# Patient Record
Sex: Male | Born: 1960
Health system: Southern US, Community
[De-identification: ages and names within clinical notes are randomized; demographics above are authoritative.]

## PROBLEM LIST (undated history)

## (undated) DIAGNOSIS — E785 Hyperlipidemia, unspecified: Secondary | ICD-10-CM

## (undated) DIAGNOSIS — T7840XA Allergy, unspecified, initial encounter: Secondary | ICD-10-CM

## (undated) DIAGNOSIS — J449 Chronic obstructive pulmonary disease, unspecified: Secondary | ICD-10-CM

## (undated) DIAGNOSIS — J45909 Unspecified asthma, uncomplicated: Secondary | ICD-10-CM

## (undated) HISTORY — DX: Unspecified asthma, uncomplicated: J45.909

## (undated) HISTORY — DX: Hyperlipidemia, unspecified: E78.5

## (undated) HISTORY — PX: COLONOSCOPY: SHX174

## (undated) HISTORY — DX: Allergy, unspecified, initial encounter: T78.40XA

## (undated) HISTORY — DX: Chronic obstructive pulmonary disease, unspecified: J44.9

---

## 2017-07-05 ENCOUNTER — Ambulatory Visit (INDEPENDENT_AMBULATORY_CARE_PROVIDER_SITE_OTHER): Payer: BLUE CROSS/BLUE SHIELD | Admitting: Family Medicine

## 2017-07-05 ENCOUNTER — Ambulatory Visit (INDEPENDENT_AMBULATORY_CARE_PROVIDER_SITE_OTHER): Payer: BLUE CROSS/BLUE SHIELD

## 2017-07-05 ENCOUNTER — Encounter: Payer: Self-pay | Admitting: Family Medicine

## 2017-07-05 VITALS — BP 114/82 | HR 64 | Temp 97.9°F | Resp 16 | Ht 62.0 in | Wt 146.2 lb

## 2017-07-05 DIAGNOSIS — Z1159 Encounter for screening for other viral diseases: Secondary | ICD-10-CM

## 2017-07-05 DIAGNOSIS — Z125 Encounter for screening for malignant neoplasm of prostate: Secondary | ICD-10-CM

## 2017-07-05 DIAGNOSIS — R05 Cough: Secondary | ICD-10-CM

## 2017-07-05 DIAGNOSIS — Z0000 Encounter for general adult medical examination without abnormal findings: Secondary | ICD-10-CM

## 2017-07-05 DIAGNOSIS — Z1211 Encounter for screening for malignant neoplasm of colon: Secondary | ICD-10-CM

## 2017-07-05 DIAGNOSIS — R059 Cough, unspecified: Secondary | ICD-10-CM

## 2017-07-05 DIAGNOSIS — Z23 Encounter for immunization: Secondary | ICD-10-CM | POA: Diagnosis not present

## 2017-07-05 DIAGNOSIS — Z114 Encounter for screening for human immunodeficiency virus [HIV]: Secondary | ICD-10-CM

## 2017-07-05 DIAGNOSIS — Z131 Encounter for screening for diabetes mellitus: Secondary | ICD-10-CM

## 2017-07-05 DIAGNOSIS — J4520 Mild intermittent asthma, uncomplicated: Secondary | ICD-10-CM

## 2017-07-05 DIAGNOSIS — Z13220 Encounter for screening for lipoid disorders: Secondary | ICD-10-CM

## 2017-07-05 MED ORDER — ALBUTEROL SULFATE HFA 108 (90 BASE) MCG/ACT IN AERS: 1.0000 | INHALATION_SPRAY | RESPIRATORY_TRACT | 0 refills | Status: DC | PRN

## 2017-07-05 MED ORDER — FLUTICASONE PROPIONATE HFA 44 MCG/ACT IN AERO: 2.0000 | INHALATION_SPRAY | Freq: Two times a day (BID) | RESPIRATORY_TRACT | 3 refills | Status: DC

## 2017-07-05 NOTE — Patient Instructions (Addendum)
Cough may be due to multiple causes: Try zyrtec once per day for allergies, zantac once or  twice per day for heartburn, and start new inhaler fluticasone 2 times per day (every day).  Can use albuterol inhaler IF NEEDED for wheezing or worse cough. Recheck cough in next 3-4 weeks.  Return to the clinic or go to the nearest emergency room if any of your symptoms worsen or new symptoms occur.  I will refer you for colonoscopy.   Thanks you for coming in today!  Keeping you healthy  Get these tests  Blood pressure- Have your blood pressure checked once a year by your healthcare provider.  Normal blood pressure is 120/80  Weight- Have your body mass index (BMI) calculated to screen for obesity.  BMI is a measure of body fat based on height and weight. You can also calculate your own BMI at [email or web address].  Cholesterol- Have your cholesterol checked every year.  Diabetes- Have your blood sugar checked regularly if you have high blood pressure, high cholesterol, have a family history of diabetes or if you are overweight.  Screening for Colon Cancer- Colonoscopy starting at age 69.  Screening may begin sooner depending on your family history and other health conditions. Follow up colonoscopy as directed by your Gastroenterologist.  Screening for Prostate Cancer- Both blood work (PSA) and a rectal exam help screen for Prostate Cancer.  Screening begins at age 37 with African-American men and at age 30 with Caucasian men.  Screening may begin sooner depending on your family history.  Take these medicines  Aspirin- One aspirin daily can help prevent Heart disease and Stroke.  Flu shot- Every fall.  Tetanus- Every 10 years.  Zostavax- Once after the age of 15 to prevent Shingles.  Pneumonia shot- Once after the age of 60; if you are younger than 65, ask your healthcare provider if you need a Pneumonia shot.  Take these steps  Don't smoke- If you do smoke, talk to your doctor  about quitting.  For tips on how to quit, go to www.smokefree.gov or call 1-800-QUIT-NOW.  Be physically active- Exercise 5 days a week for at least 30 minutes.  If you are not already physically active start slow and gradually work up to 30 minutes of moderate physical activity.  Examples of moderate activity include walking briskly, mowing the yard, dancing, swimming, bicycling, etc.  Eat a healthy diet- Eat a variety of healthy food such as fruits, vegetables, low fat milk, low fat cheese, yogurt, lean meant, poultry, fish, beans, tofu, etc. For more information go to www.thenutritionsource.org  Drink alcohol in moderation- Limit alcohol intake to less than two drinks a day. Never drink and drive.  Dentist- Brush and floss twice daily; visit your dentist twice a year.  Depression- Your emotional health is as important as your physical health. If you're feeling down, or losing interest in things you would normally enjoy please talk to your healthcare provider.  Eye exam- Visit your eye doctor every year.  Safe sex- If you may be exposed to a sexually transmitted infection, use a condom.  Seat belts- Seat belts can save your life; always wear one.  Smoke/Carbon Monoxide detectors- These detectors need to be installed on the appropriate level of your home.  Replace batteries at least once a year.  Skin cancer- When out in the sun, cover up and use sunscreen 15 SPF or higher.  Violence- If anyone is threatening you, please tell your healthcare provider.  Living Will/ Health  care power of attorney- Speak with your healthcare provider and family.  Ho, Ng???i l??n (Cough, Adult) Ho l ph?n x? la?m sa?ch c? h?ng va? khi? qua?n cu?a quy? vi?. Ho gip ch?a lnh v b?o v? ph?i c?a quy? vi?. Thi?nh thoa?ng ho l bnh th??ng, nh?ng ho ke?m theo cc tri?u ch?ng khc ho?c ko di c th? l m?t d?u hi?u c?a m?t b?nh ly? c?n ?i?u tr?Marland Kitchen Ho c th? ko da?i ch? 2-3 tu?n (c?p tnh), ho?c n c th? ko di  h?n 8 tu?n (m?n tnh). [place] th??ng la? do:  Hi?t vo cc ch?t gy kch ?ng ph?i c?a quy? vi?.  Nhi?m vi rt ho?c vi khu?n ???ng h h?p.  D? ?ng.  Hen suy?n.  Cha?y di?ch mu?i sau.  Ht thu?c l.  Axi?t tra?o ng???c t?? d? dy ln th?c qu?n (tro ng??c d? dy th??c qua?n).  M?t s? lo?i thu?c nh?t ??nh.  Nh??ng v?n ?? ph?i ma?n tnh, k? c? COPD (ho?c hi?m khi, ung th? ph?i).  Cc ti?nh tra?ng b?nh ly? kha?c ch?ng h?n nh? suy tim. H??NG D?N CH?M [place] T?I NH Ch  ??n nh?ng thay ??i v? tri?u ch?ng c?a qu v?. Ti?n hnh nh?ng hnh ??ng sau ?? gip gi?m kho? chi?u:  Ch? s? d?ng thu?c theo ch? d?n c?a chuyn gia ch?m [place] s?c kh?e cu?a quy? vi?. ? N?u qu v? ???c k thu?c khng sinh, hy dng thu?c theo ch? d?n c?a chuyn gia ch?m [place] s?c kh?e. Khng d?ng u?ng thu?c khng sinh ngay c? khi qu v? b?t ??u c?m th?y ?? h?n. ? Ni chuy?n v?i chuyn gia ch?m [place] s?c kh?e tr??c khi quy? vi? du?ng m?t lo?i thu?c gia?m ho.  U?ng ?? n??c ?? gi? cho n??c ti?u trong ho?c c mu vng nh?t.  N?u khng kh kh, ha?y s? d?ng m?t ma?y h?i n???c ho??c ma?y t?o ?m trong phng ng? ho?c nha? quy? vi? ?? gip la?m l?ng di?ch ti?t ra.  Trnh b?t c? ?i?u g la?m quy? vi? ho t?i n?i lm vi?c hay ? nh.  N?u quy? vi? ho n??ng h?n vo ban ?m, hy th? ng? ?? v? tr n??a ng?i n??a n??m.  Hessie Diener khi thu?c l. N?u qu v? ht thu?c, ha?y cai thu?c. N?u qu v? c?n gip ?? ?? cai thu?c, hy h?i chuyn gia ch?m [place] s?c kh?e.  Trnh caffeine.  Trnh u?ng r??u.  Ngh? ng?i khi c?n. ?I KHM N?U:  Qu v? c cc tri?u ch?ng m?i.  Quy? vi? ho ra m?Floyde Parkins? vi? khng ??? ho sau 2-3 tu?n, ho?c quy? vi? ho n?ng h?n.  Qu v? khng th? ki?m sot c?n ho c?a mnh b?ng thu?c ho v quy? vi? b? m?t ng?.  Quy? vi? bi? ?au ma? tr?? nn n??ng h?n ho?c ?au m khng ki?m sot ????c b??ng thu?c gia?m ?au.  Qu v? b? s?t.  Qu v? b? s?t cn khng r nguyn nhn.  Qu v? ?? m? hi ban ?m.  NGAY L?P T?C  ?I KHM N?U:  Qu v? ho ra mu.  Qu v? b? kh th?.  Nh?p tim c?a quy? vi? r?t nhanh.  Thng tin ny khng nh?m m?c ?ch thay th? cho l?i khuyn m chuyn gia ch?m [place] s?c kh?e ni v?i qu v?. Hy b?o ??m qu v? ph?i th?o lu?n b?t k? v?n ?? g m qu v? c v?i chuyn gia ch?m  s?c kh?e c?a qu v?. Document Released: 12/29/2015 Document Revised: 12/29/2015 Document  Reviewed: 11/13/2014 Elsevier Interactive Patient Education  2017 [employer].   IF you received an x-ray today, you will receive an invoice from [facility] Radiology. Please contact [facility] Radiology at [phone] with questions or concerns regarding your invoice.   IF you received labwork today, you will receive an invoice from [place]. Please contact [facility] at [phone] with questions or concerns regarding your invoice.   Our billing staff will not be able to assist you with questions regarding bills from these companies.  You will be contacted with the lab results as soon as they are available. The fastest way to get your results is to activate your My Chart account. Instructions are located on the last page of this paperwork. If you have not heard from [place] regarding the results in 2 weeks, please contact this office.

## 2017-07-05 NOTE — Progress Notes (Signed)
Subjective:  By signing my name below, I, Timothy Briggs, attest that this documentation has been prepared under the direction and in the presence of Timothy Agreste, MD Electronically Signed: Ladene Artist, ED Scribe 07/05/2017 at 1:59 PM.   Patient ID: Timothy Briggs, male    DOB: [birth year], 56 y.o.   MRN: [id number]  Chief Complaint  Patient presents with  . Annual Exam  . Cough    stated on history form he has had a cough for awhile   HPI Timothy Briggs is a 56 y.o. male who presents to Primary Care at [facility] for a physical. New pt to our office. He is also presenting with cough. Pt's girlfriend is present to translate.   Cough Pt reports he has had a productive cough with mucous for several years. Reports some congestion, itchy and watery eyes. Previously prescribed Zyrtec by his former PCP which he is no longer taking. Pt has been seen at [facility] twice last year for asthma exacerbation, given albuterol inhaler which he takes once/week. Pt states albuterol improves cough temporarily but cough returns daily. Pt had a negative TB screening since [place], reports cough started here. Denies exposure to TB, waking in the middle of the night sob, hemoptysis, night sweats, fever, unexplained weight loss, heartburn, belching. Pt is a former smoker; started in 1980, quit for 10 years and quit again in 01/2016.   CA Screening Colonoscopy: ~10 years ago for hemorrhoids  Prostate CA Screening:  No results found for: PSA  Immunizations  There is no immunization history on file for this patient. Flu vaccine: today Tetanus: today Hep C Screening: agrees to screening at this visit HIV Screening: agrees to screening at this visit  Depression Screening Depression screen [facility] 2/9 07/05/2017  Decreased Interest 0  Down, Depressed, Hopeless 0  PHQ - 2 Score 0     Visual Acuity Screening   Right eye Left eye Both eyes  Without correction: 20/25 20/25 20/25-1  With correction:       Vision: pt has not seen an eye doctor Dentist: last visit was 2 years ago Exercise: none outside of work.   There are no active problems to display for this patient.  Past Medical History:  Diagnosis Date  . Allergy   . Asthma    No past surgical history on file. No Known Allergies Prior to Admission medications   Not on File   Social History   Social History  . Marital status: Unknown    Spouse name: N/A  . Number of children: N/A  . Years of education: N/A   Occupational History  . Not on file.   Social History Main Topics  . Smoking status: Never Smoker  . Smokeless tobacco: Never Used  . Alcohol use Not on file  . Drug use: No  . Sexual activity: Not on file   Other Topics Concern  . Not on file   Social History Narrative  . No narrative on file   Review of Systems  Constitutional: Negative for diaphoresis, fever and unexpected weight change.  HENT: Positive for congestion.   Eyes: Positive for itching.  Respiratory: Positive for cough. Negative for shortness of breath.       Objective:   Physical Exam  Constitutional: He is oriented to person, place, and time. He appears well-developed and well-nourished.  HENT:  Head: Normocephalic and atraumatic.  Right Ear: External ear normal.  Left Ear: External ear normal.  Mouth/Throat: Oropharynx is clear and moist.  L TM: moderate cerumen, none obstructive  Eyes: Pupils are equal, round, and reactive to light. Conjunctivae and EOM are normal.  Neck: Normal range of motion. Neck supple. No thyromegaly present.  Cardiovascular: Normal rate, regular rhythm, normal heart sounds and intact distal pulses.   Pulmonary/Chest: Effort normal and breath sounds normal. No respiratory distress. He has no wheezes. He has no rales.  Abdominal: Soft. He exhibits no distension. There is no tenderness. Hernia confirmed negative in the right inguinal area and confirmed negative in the left inguinal area.  Genitourinary:  Prostate normal.  Musculoskeletal: Normal range of motion. He exhibits no edema or tenderness.  Lymphadenopathy:    He has no cervical adenopathy.  Neurological: He is alert and oriented to person, place, and time. He has normal reflexes.  Skin: Skin is warm and dry.  Psychiatric: He has a normal mood and affect. His behavior is normal.  Vitals reviewed.  Vitals:   07/05/17 1332  BP: 114/82  Pulse: 64  Resp: 16  Temp: 97.9 F (36.6 C)  TempSrc: Oral  SpO2: 99%  Weight: 146 lb 3.2 oz (66.3 kg)  Height: 5\' 2"  (1.575 m)  Dg Chest 2 View  Result Date: 07/05/2017 CLINICAL DATA:  Cough.  Former smoker. EXAM: CHEST  2 VIEW COMPARISON:  None FINDINGS: The heart size and mediastinal contours are within normal limits. Both lungs are clear. The visualized skeletal structures are unremarkable. IMPRESSION: No active cardiopulmonary disease. Electronically Signed   By: [name] M.D.   On: 07/05/2017 14:38       Assessment & Plan:   Brain Honeycutt is a 56 y.o. male Annual physical exam  - -anticipatory guidance as below in AVS, screening labs above. Health maintenance items as above in HPI discussed/recommended as applicable.   Need for influenza vaccination - Plan: Flu Vaccine QUAD 6+ mos PF IM (Fluarix Quad PF)  Need for Tdap vaccination - Plan: Tdap vaccine greater than or equal to 7yo IM  Mild intermittent asthma, unspecified whether complicated - Plan: albuterol (PROVENTIL HFA;VENTOLIN HFA) 108 (90 Base) MCG/ACT inhaler, fluticasone (FLOVENT HFA) 44 MCG/ACT inhaler Cough - Plan: CBC, DG Chest 2 View, fluticasone (FLOVENT HFA) 44 MCG/ACT inhaler  - May be multifactorial including allergies, reflux with upper airway cough syndrome, versus persistent asthma symptoms. No red flags on history or exam, chest x-ray reassuring, with prior tobacco use.  -Start Zyrtec, Zantac daily, add Flovent inhaler daily for asthma, with albuterol as needed for breakthrough symptoms  -Recheck in 3-4  weeks. Sooner if worse  Need for hepatitis C screening test - Plan: Hepatitis C antibody  Screening for HIV (human immunodeficiency virus) - Plan: HIV antibody  Screening for diabetes mellitus - Plan: Comprehensive metabolic panel  Screening for prostate cancer - Plan: PSA  -We discussed pros and cons of prostate cancer screening, and after this discussion, he chose to have screening done. PSA obtained, and no concerning findings on DRE.   Screen for colon cancer - Plan: Ambulatory referral to Gastroenterology  Screening for hyperlipidemia - Plan: Lipid panel  English spoken and translator present with understanding expressed  Meds ordered this encounter  Medications  . albuterol (PROVENTIL HFA;VENTOLIN HFA) 108 (90 Base) MCG/ACT inhaler    Sig: Inhale 1-2 puffs into the lungs every 4 (four) hours as needed for wheezing or shortness of breath.    Dispense:  1 Inhaler    Refill:  0  . fluticasone (FLOVENT HFA) 44 MCG/ACT inhaler    Sig: Inhale 2  puffs into the lungs 2 (two) times daily.    Dispense:  1 Inhaler    Refill:  3   Patient Instructions   Cough may be due to multiple causes: Try zyrtec once per day for allergies, zantac once or  twice per day for heartburn, and start new inhaler fluticasone 2 times per day (every day).  Can use albuterol inhaler IF NEEDED for wheezing or worse cough. Recheck cough in next 3-4 weeks.  Return to the clinic or go to the nearest emergency room if any of your symptoms worsen or new symptoms occur.  I will refer you for colonoscopy.   Thanks you for coming in today!  Keeping you healthy  Get these tests  Blood pressure- Have your blood pressure checked once a year by your healthcare provider.  Normal blood pressure is 120/80  Weight- Have your body mass index (BMI) calculated to screen for obesity.  BMI is a measure of body fat based on height and weight. You can also calculate your own BMI at [email or web address].  Cholesterol-  Have your cholesterol checked every year.  Diabetes- Have your blood sugar checked regularly if you have high blood pressure, high cholesterol, have a family history of diabetes or if you are overweight.  Screening for Colon Cancer- Colonoscopy starting at age 92.  Screening may begin sooner depending on your family history and other health conditions. Follow up colonoscopy as directed by your Gastroenterologist.  Screening for Prostate Cancer- Both blood work (PSA) and a rectal exam help screen for Prostate Cancer.  Screening begins at age 105 with African-American men and at age 43 with Caucasian men.  Screening may begin sooner depending on your family history.  Take these medicines  Aspirin- One aspirin daily can help prevent Heart disease and Stroke.  Flu shot- Every fall.  Tetanus- Every 10 years.  Zostavax- Once after the age of 20 to prevent Shingles.  Pneumonia shot- Once after the age of 53; if you are younger than 84, ask your healthcare provider if you need a Pneumonia shot.  Take these steps  Don't smoke- If you do smoke, talk to your doctor about quitting.  For tips on how to quit, go to www.smokefree.gov or call 1-800-QUIT-NOW.  Be physically active- Exercise 5 days a week for at least 30 minutes.  If you are not already physically active start slow and gradually work up to 30 minutes of moderate physical activity.  Examples of moderate activity include walking briskly, mowing the yard, dancing, swimming, bicycling, etc.  Eat a healthy diet- Eat a variety of healthy food such as fruits, vegetables, low fat milk, low fat cheese, yogurt, lean meant, poultry, fish, beans, tofu, etc. For more information go to www.thenutritionsource.org  Drink alcohol in moderation- Limit alcohol intake to less than two drinks a day. Never drink and drive.  Dentist- Brush and floss twice daily; visit your dentist twice a year.  Depression- Your emotional health is as important as your  physical health. If you're feeling down, or losing interest in things you would normally enjoy please talk to your healthcare provider.  Eye exam- Visit your eye doctor every year.  Safe sex- If you may be exposed to a sexually transmitted infection, use a condom.  Seat belts- Seat belts can save your life; always wear one.  Smoke/Carbon Monoxide detectors- These detectors need to be installed on the appropriate level of your home.  Replace batteries at least once a year.  Skin cancer- When out  in the sun, cover up and use sunscreen 15 SPF or higher.  Violence- If anyone is threatening you, please tell your healthcare provider.  Living Will/ Health care power of attorney- Speak with your healthcare provider and family.  Ho, Ng???i l??n (Cough, Adult) Ho l ph?n x? la?m sa?ch c? h?ng va? khi? qua?n cu?a quy? vi?. Ho gip ch?a lnh v b?o v? ph?i c?a quy? vi?. Thi?nh thoa?ng ho l bnh th??ng, nh?ng ho ke?m theo cc tri?u ch?ng khc ho?c ko di c th? l m?t d?u hi?u c?a m?t b?nh ly? c?n ?i?u tr?Marland Kitchen Ho c th? ko da?i ch? 2-3 tu?n (c?p tnh), ho?c n c th? ko di h?n 8 tu?n (m?n tnh). [place] th??ng la? do:  Hi?t vo cc ch?t gy kch ?ng ph?i c?a quy? vi?.  Nhi?m vi rt ho?c vi khu?n ???ng h h?p.  D? ?ng.  Hen suy?n.  Cha?y di?ch mu?i sau.  Ht thu?c l.  Axi?t tra?o ng???c t?? d? dy ln th?c qu?n (tro ng??c d? dy th??c qua?n).  M?t s? lo?i thu?c nh?t ??nh.  Nh??ng v?n ?? ph?i ma?n tnh, k? c? COPD (ho?c hi?m khi, ung th? ph?i).  Cc ti?nh tra?ng b?nh ly? kha?c ch?ng h?n nh? suy tim. H??NG D?N CH?M [place] T?I NH Ch  ??n nh?ng thay ??i v? tri?u ch?ng c?a qu v?. Ti?n hnh nh?ng hnh ??ng sau ?? gip gi?m kho? chi?u:  Ch? s? d?ng thu?c theo ch? d?n c?a chuyn gia ch?m [place] s?c kh?e cu?a quy? vi?. ? N?u qu v? ???c k thu?c khng sinh, hy dng thu?c theo ch? d?n c?a chuyn gia ch?m [place] s?c kh?e. Khng d?ng u?ng thu?c khng sinh ngay c? khi qu v? b?t ??u c?m  th?y ?? h?n. ? Ni chuy?n v?i chuyn gia ch?m [place] s?c kh?e tr??c khi quy? vi? du?ng m?t lo?i thu?c gia?m ho.  U?ng ?? n??c ?? gi? cho n??c ti?u trong ho?c c mu vng nh?t.  N?u khng kh kh, ha?y s? d?ng m?t ma?y h?i n???c ho??c ma?y t?o ?m trong phng ng? ho?c nha? quy? vi? ?? gip la?m l?ng di?ch ti?t ra.  Trnh b?t c? ?i?u g la?m quy? vi? ho t?i n?i lm vi?c hay ? nh.  N?u quy? vi? ho n??ng h?n vo ban ?m, hy th? ng? ?? v? tr n??a ng?i n??a n??m.  Hessie Diener khi thu?c l. N?u qu v? ht thu?c, ha?y cai thu?c. N?u qu v? c?n gip ?? ?? cai thu?c, hy h?i chuyn gia ch?m [place] s?c kh?e.  Trnh caffeine.  Trnh u?ng r??u.  Ngh? ng?i khi c?n. ?I KHM N?U:  Qu v? c cc tri?u ch?ng m?i.  Quy? vi? ho ra m?Floyde Parkins? vi? khng ??? ho sau 2-3 tu?n, ho?c quy? vi? ho n?ng h?n.  Qu v? khng th? ki?m sot c?n ho c?a mnh b?ng thu?c ho v quy? vi? b? m?t ng?.  Quy? vi? bi? ?au ma? tr?? nn n??ng h?n ho?c ?au m khng ki?m sot ????c b??ng thu?c gia?m ?au.  Qu v? b? s?t.  Qu v? b? s?t cn khng r nguyn nhn.  Qu v? ?? m? hi ban ?m.  NGAY L?P T?C ?I KHM N?U:  Qu v? ho ra mu.  Qu v? b? kh th?.  Nh?p tim c?a quy? vi? r?t nhanh.  Thng tin ny khng nh?m m?c ?ch thay th? cho l?i khuyn m chuyn gia ch?m [place] s?c kh?e ni v?i qu v?. Hy b?o ??m qu v? ph?i th?o  lu?n b?t k? v?n ?? g m qu v? c v?i chuyn gia ch?m  s?c kh?e c?a qu v?. Document Released: 12/29/2015 Document Revised: 12/29/2015 Document Reviewed: 11/13/2014 Elsevier Interactive Patient Education  2017 [place].   IF you received an x-ray today, you will receive an invoice from [facility] Radiology. Please contact [facility] Radiology at [phone] with questions or concerns regarding your invoice.   IF you received labwork today, you will receive an invoice from [place]. Please contact [facility] at [phone] with questions or concerns regarding your invoice.   Our billing staff will  not be able to assist you with questions regarding bills from these companies.  You will be contacted with the lab results as soon as they are available. The fastest way to get your results is to activate your My Chart account. Instructions are located on the last page of this paperwork. If you have not heard from [place] regarding the results in 2 weeks, please contact this office.       I personally performed the services described in this documentation, which was scribed in my presence. The recorded information has been reviewed and considered for accuracy and completeness, addended by me as needed, and agree with information above.  Signed,   Merri Ray, MD Primary Care at [place].  07/05/17 2:49 PM

## 2017-07-06 LAB — COMPREHENSIVE METABOLIC PANEL
A/G RATIO: 1.6 (ref 1.2–2.2)
ALBUMIN: 4.7 g/dL (ref 3.5–5.5)
ALT: 20 IU/L (ref 0–44)
AST: 22 IU/L (ref 0–40)
Alkaline Phosphatase: 64 IU/L (ref 39–117)
BILIRUBIN TOTAL: 0.3 mg/dL (ref 0.0–1.2)
BUN/Creatinine Ratio: 10 (ref 9–20)
BUN: 11 mg/dL (ref 6–24)
CO2: 27 mmol/L (ref 20–29)
Calcium: 10.3 mg/dL — ABNORMAL HIGH (ref 8.7–10.2)
Chloride: 102 mmol/L (ref 96–106)
Creatinine, Ser: 1.09 mg/dL (ref 0.76–1.27)
GFR calc Af Amer: 87 mL/min/{1.73_m2} (ref >59)
GFR, EST NON AFRICAN AMERICAN: 75 mL/min/{1.73_m2} (ref >59)
Globulin, Total: 2.9 g/dL (ref 1.5–4.5)
Glucose: 92 mg/dL (ref 65–99)
Potassium: 4.4 mmol/L (ref 3.5–5.2)
SODIUM: 143 mmol/L (ref 134–144)
TOTAL PROTEIN: 7.6 g/dL (ref 6.0–8.5)

## 2017-07-06 LAB — CBC
HEMATOCRIT: 45.1 % (ref 37.5–51.0)
HEMOGLOBIN: 14.4 g/dL (ref 13.0–17.7)
MCH: 27.7 pg (ref 26.6–33.0)
MCHC: 31.9 g/dL (ref 31.5–35.7)
MCV: 87 fL (ref 79–97)
Platelets: 272 10*3/uL (ref 150–379)
RBC: 5.2 x10E6/uL (ref 4.14–5.80)
RDW: 14.7 % (ref 12.3–15.4)
WBC: 7.5 10*3/uL (ref 3.4–10.8)

## 2017-07-06 LAB — LIPID PANEL
CHOLESTEROL TOTAL: 221 mg/dL — AB (ref 100–199)
Chol/HDL Ratio: 5.3 ratio — ABNORMAL HIGH (ref 0.0–5.0)
HDL: 42 mg/dL (ref >39)
LDL CALC: 143 mg/dL — AB (ref 0–99)
TRIGLYCERIDES: 178 mg/dL — AB (ref 0–149)
VLDL CHOLESTEROL CAL: 36 mg/dL (ref 5–40)

## 2017-07-06 LAB — PSA: PROSTATE SPECIFIC AG, SERUM: 2.5 ng/mL (ref 0.0–4.0)

## 2017-07-06 LAB — HIV ANTIBODY (ROUTINE TESTING W REFLEX): HIV Screen 4th Generation wRfx: NONREACTIVE

## 2017-07-06 LAB — HEPATITIS C ANTIBODY: Hep C Virus Ab: <0.1 s/co ratio (ref 0.0–0.9)

## 2017-07-11 ENCOUNTER — Encounter: Payer: Self-pay | Admitting: Gastroenterology

## 2017-07-19 ENCOUNTER — Encounter: Payer: Self-pay | Admitting: Gastroenterology

## 2017-07-19 ENCOUNTER — Ambulatory Visit (AMBULATORY_SURGERY_CENTER): Payer: Self-pay | Admitting: *Deleted

## 2017-07-19 VITALS — Ht 62.0 in | Wt 143.4 lb

## 2017-07-19 DIAGNOSIS — Z1211 Encounter for screening for malignant neoplasm of colon: Secondary | ICD-10-CM

## 2017-07-19 MED ORDER — NA SULFATE-K SULFATE-MG SULF 17.5-3.13-1.6 GM/177ML PO SOLN: 1.0000 [IU] | Freq: Once | ORAL | 0 refills | Status: AC

## 2017-07-19 NOTE — Progress Notes (Signed)
No egg or soy allergy known to patient  No issues with past sedation with any surgeries  or procedures, no intubation problems  No diet pills per patient No home 02 use per patient  No blood thinners per patient  Pt denies issues with constipation  No A fib or A flutter  EMMI video sent to pt's e mail  

## 2017-07-20 ENCOUNTER — Encounter: Payer: Self-pay | Admitting: Radiology

## 2017-07-20 ENCOUNTER — Telehealth: Payer: Self-pay | Admitting: Gastroenterology

## 2017-07-20 NOTE — Telephone Encounter (Signed)
GF, [name] states suprep is over 100 dollars and they cannot afford.  She is asking for A PA for the prep- instructed we dont do PA- instructed her that we can give them a sample- she is to pick up before 5 pm Friday - she verbalized understanding to come to 4th floor [facility]c to pick up  Medication Samples have been provided to the patient.  Drug name: suprep           Qty: 1  LOT: [id number]  Exp.Date: 9-20  Dosing instructions: as directed   The patient has been instructed regarding the correct time, dose, and frequency of taking this medication, including desired effects and most common side effects.   Timothy Briggs 3:18 PM 07/20/2017

## 2017-07-26 ENCOUNTER — Ambulatory Visit (AMBULATORY_SURGERY_CENTER): Payer: BLUE CROSS/BLUE SHIELD | Admitting: Gastroenterology

## 2017-07-26 ENCOUNTER — Encounter: Payer: Self-pay | Admitting: Gastroenterology

## 2017-07-26 VITALS — BP 96/66 | HR 59 | Temp 98.7°F | Resp 18 | Ht 62.0 in | Wt 143.0 lb

## 2017-07-26 DIAGNOSIS — D123 Benign neoplasm of transverse colon: Secondary | ICD-10-CM

## 2017-07-26 DIAGNOSIS — Z1211 Encounter for screening for malignant neoplasm of colon: Secondary | ICD-10-CM

## 2017-07-26 DIAGNOSIS — D124 Benign neoplasm of descending colon: Secondary | ICD-10-CM | POA: Diagnosis not present

## 2017-07-26 DIAGNOSIS — Z1212 Encounter for screening for malignant neoplasm of rectum: Secondary | ICD-10-CM | POA: Diagnosis not present

## 2017-07-26 MED ORDER — SODIUM CHLORIDE 0.9 % IV SOLN: 500.0000 mL | INTRAVENOUS | Status: DC

## 2017-07-26 NOTE — Progress Notes (Signed)
Pt's states no medical or surgical changes since previsit or office visit. 

## 2017-07-26 NOTE — Patient Instructions (Signed)
Handout given on polyps  YOU HAD AN ENDOSCOPIC PROCEDURE TODAY: Refer to the procedure report and other information in the discharge instructions given to you for any specific questions about what was found during the examination. If this information does not answer your questions, please call [place] office at [phone] to clarify.   YOU SHOULD EXPECT: Some feelings of bloating in the abdomen. Passage of more gas than usual. Walking can help get rid of the air that was put into your GI tract during the procedure and reduce the bloating. If you had a lower endoscopy (such as a colonoscopy or flexible sigmoidoscopy) you may notice spotting of blood in your stool or on the toilet paper. Some abdominal soreness may be present for a day or two, also.  DIET: Your first meal following the procedure should be a light meal and then it is ok to progress to your normal diet. A half-sandwich or bowl of soup is an example of a good first meal. Heavy or fried foods are harder to digest and may make you feel nauseous or bloated. Drink plenty of fluids but you should avoid alcoholic beverages for 24 hours. If you had a esophageal dilation, please see attached instructions for diet.    ACTIVITY: Your care partner should take you home directly after the procedure. You should plan to take it easy, moving slowly for the rest of the day. You can resume normal activity the day after the procedure however YOU SHOULD NOT DRIVE, use power tools, machinery or perform tasks that involve climbing or major physical exertion for 24 hours (because of the sedation medicines used during the test).   SYMPTOMS TO REPORT IMMEDIATELY: A gastroenterologist can be reached at any hour. Please call [phone]  for any of the following symptoms:  Following lower endoscopy (colonoscopy, flexible sigmoidoscopy) Excessive amounts of blood in the stool  Significant tenderness, worsening of abdominal pains  Swelling of the abdomen that is  new, acute  Fever of 100 or higher    FOLLOW UP:  If any biopsies were taken you will be contacted by phone or by letter within the next 1-3 weeks. Call [phone]  if you have not heard about the biopsies in 3 weeks.  Please also call with any specific questions about appointments or follow up tests.  

## 2017-07-26 NOTE — Op Note (Signed)
[facility] Patient Name: Timothy Briggs Procedure Date: 07/26/2017 11:10 AM MRN: [id number] Endoscopist: Remo Lipps P. Armbruster MD, MD Age: 56 Referring MD:  Date of Birth: 01-10-1961 Gender: Male Account #: [id number] Procedure:                Colonoscopy Indications:              Screening for colorectal malignant neoplasm Medicines:                Monitored Anesthesia Care Procedure:                Pre-Anesthesia Assessment:                           - Prior to the procedure, a History and Physical                            was performed, and patient medications and                            allergies were reviewed. The patient's tolerance of                            previous anesthesia was also reviewed. The risks                            and benefits of the procedure and the sedation                            options and risks were discussed with the patient.                            All questions were answered, and informed consent                            was obtained. Prior Anticoagulants: The patient has                            taken no previous anticoagulant or antiplatelet                            agents. ASA Grade Assessment: II - A patient with                            mild systemic disease. After reviewing the risks                            and benefits, the patient was deemed in                            satisfactory condition to undergo the procedure.                           After obtaining informed consent, the colonoscope  was passed under direct vision. Throughout the                            procedure, the patient's blood pressure, pulse, and                            oxygen saturations were monitored continuously. The                            Colonoscope was introduced through the anus and                            advanced to the the cecum, identified by                            appendiceal orifice  and ileocecal valve. The                            colonoscopy was performed without difficulty. The                            patient tolerated the procedure well. The quality                            of the bowel preparation was good. The ileocecal                            valve, appendiceal orifice, and rectum were                            photographed. Scope In: 11:17:23 AM Scope Out: 11:32:35 AM Scope Withdrawal Time: 0 hours 12 minutes 51 seconds  Total Procedure Duration: 0 hours 15 minutes 12 seconds  Findings:                 The perianal and digital rectal examinations were                            normal.                           A diminutive polyp was found in the transverse                            colon. The polyp was sessile. The polyp was removed                            with a cold biopsy forceps. Resection and retrieval                            were complete.                           A 4 mm polyp was found in the descending colon. The  polyp was sessile. The polyp was removed with a                            cold snare. Resection and retrieval were complete.                           Scattered medium-mouthed diverticula were found in                            the entire colon.                           Internal hemorrhoids were found during retroflexion.                           The exam was otherwise without abnormality. Complications:            No immediate complications. Estimated blood loss:                            Minimal. Estimated Blood Loss:     Estimated blood loss was minimal. Impression:               - One diminutive polyp in the transverse colon,                            removed with a cold biopsy forceps. Resected and                            retrieved.                           - One 4 mm polyp in the descending colon, removed                            with a cold snare. Resected and retrieved.                            - Diverticulosis in the entire examined colon.                           - Internal hemorrhoids.                           - The examination was otherwise normal. Recommendation:           - Patient has a contact number available for                            emergencies. The signs and symptoms of potential                            delayed complications were discussed with the                            patient. Return to normal activities tomorrow.  Written discharge instructions were provided to the                            patient.                           - Resume previous diet.                           - Continue present medications.                           - Await pathology results.                           - Repeat colonoscopy is recommended for                            surveillance. The colonoscopy date will be                            determined after pathology results from today's                            exam become available for review. Remo Lipps P. Armbruster MD, MD 07/26/2017 11:35:47 AM This report has been signed electronically.

## 2017-07-26 NOTE — Progress Notes (Signed)
Called to room to assist during endoscopic procedure.  Patient ID and intended procedure confirmed with present staff. Received instructions for my participation in the procedure from the performing physician.  

## 2017-07-26 NOTE — Progress Notes (Signed)
Report to PACU, RN, vss, BBS= Clear.  

## 2017-07-27 ENCOUNTER — Telehealth: Payer: Self-pay

## 2017-07-27 NOTE — Telephone Encounter (Signed)
  Follow up Call-  Call back number 07/26/2017  Post procedure Call Back phone  # [phone]  Permission to leave phone message Yes  Some recent data might be hidden     Patient questions:  Do you have a fever, pain , or abdominal swelling? No. Pain Score  0 *  Have you tolerated food without any problems? Yes.    Have you been able to return to your normal activities? Yes.    Do you have any questions about your discharge instructions: Diet   No. Medications  No. Follow up visit  No.  Do you have questions or concerns about your Care? No.  Actions: * If pain score is 4 or above: No action needed, pain <4.  I tried to call [phone] the tel number pt left for the call back, and the line just made a beeping sound.  I tried same number a second time and the same sound.  I called the number [phone] which was in the snap shot screen and the pt answered.  He reported no problems or complaints. maw

## 2017-07-29 ENCOUNTER — Encounter: Payer: Self-pay | Admitting: Gastroenterology

## 2017-08-02 ENCOUNTER — Other Ambulatory Visit: Payer: Self-pay

## 2017-08-02 ENCOUNTER — Ambulatory Visit: Payer: BLUE CROSS/BLUE SHIELD | Admitting: Family Medicine

## 2017-08-02 ENCOUNTER — Encounter: Payer: Self-pay | Admitting: Family Medicine

## 2017-08-02 VITALS — BP 98/70 | HR 76 | Temp 97.8°F | Resp 16 | Ht 62.0 in | Wt 141.8 lb

## 2017-08-02 DIAGNOSIS — R05 Cough: Secondary | ICD-10-CM | POA: Diagnosis not present

## 2017-08-02 DIAGNOSIS — J309 Allergic rhinitis, unspecified: Secondary | ICD-10-CM

## 2017-08-02 DIAGNOSIS — R12 Heartburn: Secondary | ICD-10-CM

## 2017-08-02 DIAGNOSIS — R059 Cough, unspecified: Secondary | ICD-10-CM

## 2017-08-02 MED ORDER — CETIRIZINE HCL 10 MG PO TABS: 10.0000 mg | ORAL_TABLET | Freq: Every day | ORAL | 3 refills | Status: DC

## 2017-08-02 MED ORDER — RANITIDINE HCL 150 MG PO TABS: 150.0000 mg | ORAL_TABLET | Freq: Two times a day (BID) | ORAL | 1 refills | Status: DC

## 2017-08-02 NOTE — Patient Instructions (Addendum)
Continue Flovent inhaler 2 puffs twice per day, albuterol if needed for wheezing or shortness of breath.  For allergies, please start her tech 1 pill once per day, Flonase nasal spray if needed for congestion.  Some of your cough may also be related to heartburn as that can cause the chest symptoms you are describing. See information below on foods to avoid, start Zantac 150 mg twice per day.  If cough is not improved in the next 2 weeks, I can refer you to pulmonary doctor. If fevers, chest pain, worsening shortness of breath, or other worsening symptoms, return here or the emergency room sooner.  ? nng (Heartburn) ? nng l m?t lo?i ?au hay kh ch?u c th? x?y ra trong c? h?ng ho?c ng?c. N th??ng ???c m t? nh? m?t c?n ?au rt. N c?ng c th? gy ra m?t vi? ???ng trong mi?ng. ? nng c th? c?m th?y t?i t? h?n khi quy? vi? n?m xu?ng ho?c ci xu?ng, v n th??ng n?ng h?n vo ban ?m. ? nng c th? xa?y ra do ca?c th?? trong d? dy di chuy?n ng???c ln th?c qu?n (tra?o ng???c). H??NG D?N CH?M [place] T?I NH Th?c hi?n nh??ng hnh ??ng na?y ?? gi?m kh ch?u v gip trnh cc bi?n ch?ng. Ch? ?? ?n u?ng  Tun th? m?t ch? ?? ?n theo khuy?n ngh? c?a chuyn gia ch?m [place] s?c kh?e. Vi?c ny c th? l trnh cc th?c ?n v ?? u?ng nh?: ? C ph v tr (c ho?c khng c caffeine). ? ?? u?ng c ch?a r??u. ? ?? u?ng t?ng l?c v ?? u?ng dng trong th? thao. ? ?? u?ng c ga ho?c soda. ? S c la v c ca. ? B?c h v h??ng v? b?c h. ? T?i v hnh. ? C?i ng?a (Horseradish). ? Cc th?c ?n nhi?u gia v? v a xt, bao g?m h?t tiu, b?t ?t, b?t ca ri, gi?m, n??c s?t cay, v n??c s?t barbecue. ? N??c qu? ho?c qu? h? cam qut, ch?ng h?n nh? cam, chanh v chanh l cam. ? Cc th?c ?n c c chua, nh? n??c x?t ??, ?t, n??c x?t salsa, v pizza km x?t ??. ? Th?c ?n chin v nhi?u ch?t bo, ch?ng h?n nh? bnh rn, khoai ty chin, khoai ty rn v n??c x?t nhi?u ch?t bo. ? Th?t nhi?u ch?t bo, ch?ng h?n nh? hot dog  (bnh m k?p xc xch) v cc lo?i th?t ?? v tr?ng nhi?u m?, ch?ng h?n nh? th?t n?c l?ng, xc xch, gi?m bng v th?t l?n xng khi. ? Nh?ng s?n ph?m b? s?a giu ch?t bo, nh? s?a nguyn kem, b? v pho mt kem.  ?n cc b?a nh?, th??ng xuyn thay v cc b?a no.  Trnh u?ng nhi?u n??c khi qu v? ?n.  Trnh ?n trong kho?ng 2-3 gi? tr??c khi ?i ng?.  Trnh n?m xu?ng ngay sau khi ?n.  Khng t?p th? d?c ngay sau khi ?n. H??ng d?n chung  Ch  ??n nh?ng thay ??i v? tri?u ch?ng c?a qu v?.  Ch? s? d?ng thu?c khng c?n k ??n v thu?c c?n k ??n theo ch? d?n c?a chuyn gia ch?m [place] s?c kh?e. Khng dng aspirin, ibuprofen, ho?c cc thu?c NSAID khc tr? khi chuyn gia ch?m [place] s?c kh?e c?a qu v? ba?o quy? vi? du?ng.  Khng s? d?ng b?t k? s?n ph?m thu?c l no, bao g?m thu?c l d?ng ht, thu?c l d?ng nhai v thu?c l ?i?n t?. N?u qu v? c?n  gip ?? ?? cai thu?c, hy h?i chuyn gia ch?m [place] s?c kh?e.  M?c qu?n o r?ng. Khng m?c ci g ch?t quanh eo m c th? t?o p l?c ln b?ng quy? vi?.  Nng cao (nng) ??u gi??ng c?a quy? vi? kho?ng 6 inch (15 cm).  C? g?ng gi?m c?ng th?ng, ch?ng h?n nh? t?p yoga ho?c thi?n. N?u qu v? c?n gip ?? ?? gi?m c?ng th?ng, hy h?i chuyn gia ch?m [place] s?c kh?e.  N?u qu v? th?a cn, hy gi?m cn n?ng v? m?c c l?i cho s?c kh?e c?a qu v?. Hy h?i chuyn gia ch?m [place] s?c kh?e ?? ???c h??ng d?n v? m?c tiu gi?m cn an ton.  Tun th? t?t c? cc cu?c h?n khm l?i theo  ki?n c?a chuyn gia ch?m [place] s?c kh?e. ?i?u ny c vai tr quan tr?ng. ?I KHM N?U:  Qu v? c cc tri?u ch?ng m?i.  Qu v? b? s?t cn khng r nguyn nhn.  Qu v? b? kh nu?t ho?c b? ?au khi nu?t.  Qu v? th? kh kh ho?c ho dai d?ng.  Cc tri?u ch?ng c?a qu v? khng c?i thi?n sau khi ???c ?i?u tr?Floyde Parkins? vi? bi? ? nng th??ng xuyn trong h?n hai tu?n.  NGAY L?P T?C ?I KHM N?U:  Qu v? b? ?au ? cnh tay, c?, hm, r?ng ho?c l?ng.  Qu v? th?y ?? m? hi, chng m?t ho?c chong  vng.  Qu v? b? ?au ng?c ho?c th? d?c.  Qu v? nn v ch?t nn ra gi?ng nh? mu ho?c b c ph.  Phn c?a qu v? c mu ho?c mu ?en.  Thng tin ny khng nh?m m?c ?ch thay th? cho l?i khuyn m chuyn gia ch?m [place] s?c kh?e ni v?i qu v?. Hy b?o ??m qu v? ph?i th?o lu?n b?t k? v?n ?? g m qu v? c v?i chuyn gia ch?m [place] s?c kh?e c?a qu v?. Document Released: 12/29/2015 Document Revised: 12/29/2015 Document Reviewed: 01/01/2015 Elsevier Interactive Patient Education  2017 [place], Ng???i l??n (Cough, Adult) Ho l ph?n x? la?m sa?ch c? h?ng va? khi? qua?n cu?a quy? vi?. Ho gip ch?a lnh v b?o v? ph?i c?a quy? vi?. Thi?nh thoa?ng ho l bnh th??ng, nh?ng ho ke?m theo cc tri?u ch?ng khc ho?c ko di c th? l m?t d?u hi?u c?a m?t b?nh ly? c?n ?i?u tr?Marland Kitchen Ho c th? ko da?i ch? 2-3 tu?n (c?p tnh), ho?c n c th? ko di h?n 8 tu?n (m?n tnh). [place] th??ng la? do:  Hi?t vo cc ch?t gy kch ?ng ph?i c?a quy? vi?.  Nhi?m vi rt ho?c vi khu?n ???ng h h?p.  D? ?ng.  Hen suy?n.  Cha?y di?ch mu?i sau.  Ht thu?c l.  Axi?t tra?o ng???c t?? d? dy ln th?c qu?n (tro ng??c d? dy th??c qua?n).  M?t s? lo?i thu?c nh?t ??nh.  Nh??ng v?n ?? ph?i ma?n tnh, k? c? COPD (ho?c hi?m khi, ung th? ph?i).  Cc ti?nh tra?ng b?nh ly? kha?c ch?ng h?n nh? suy tim. H??NG D?N CH?M [place] T?I NH Ch  ??n nh?ng thay ??i v? tri?u ch?ng c?a qu v?. Ti?n hnh nh?ng hnh ??ng sau ?? gip gi?m kho? chi?u:  Ch? s? d?ng thu?c theo ch? d?n c?a chuyn gia ch?m [place] s?c kh?e cu?a quy? vi?. ? N?u qu v? ???c k thu?c khng sinh, hy dng thu?c theo ch? d?n c?a chuyn gia ch?m [place] s?c kh?e. Khng d?ng u?ng thu?c  khng sinh ngay c? khi qu v? b?t ??u c?m th?y ?? h?n. ? Ni chuy?n v?i chuyn gia ch?m [place] s?c kh?e tr??c khi quy? vi? du?ng m?t lo?i thu?c gia?m ho.  U?ng ?? n??c ?? gi? cho n??c ti?u trong ho?c c mu vng nh?t.  N?u khng kh kh, ha?y s? d?ng m?t ma?y h?i n???c  ho??c ma?y t?o ?m trong phng ng? ho?c nha? quy? vi? ?? gip la?m l?ng di?ch ti?t ra.  Trnh b?t c? ?i?u g la?m quy? vi? ho t?i n?i lm vi?c hay ? nh.  N?u quy? vi? ho n??ng h?n vo ban ?m, hy th? ng? ?? v? tr n??a ng?i n??a n??m.  Hessie Diener khi thu?c l. N?u qu v? ht thu?c, ha?y cai thu?c. N?u qu v? c?n gip ?? ?? cai thu?c, hy h?i chuyn gia ch?m [place] s?c kh?e.  Trnh caffeine.  Trnh u?ng r??u.  Ngh? ng?i khi c?n. ?I KHM N?U:  Qu v? c cc tri?u ch?ng m?i.  Quy? vi? ho ra m?Floyde Parkins? vi? khng ??? ho sau 2-3 tu?n, ho?c quy? vi? ho n?ng h?n.  Qu v? khng th? ki?m sot c?n ho c?a mnh b?ng thu?c ho v quy? vi? b? m?t ng?.  Quy? vi? bi? ?au ma? tr?? nn n??ng h?n ho?c ?au m khng ki?m sot ????c b??ng thu?c gia?m ?au.  Qu v? b? s?t.  Qu v? b? s?t cn khng r nguyn nhn.  Qu v? ?? m? hi ban ?m.  NGAY L?P T?C ?I KHM N?U:  Qu v? ho ra mu.  Qu v? b? kh th?.  Nh?p tim c?a quy? vi? r?t nhanh.  Thng tin ny khng nh?m m?c ?ch thay th? cho l?i khuyn m chuyn gia ch?m [place] s?c kh?e ni v?i qu v?. Hy b?o ??m qu v? ph?i th?o lu?n b?t k? v?n ?? g m qu v? c v?i chuyn gia ch?m [place] s?c kh?e c?a qu v?. Document Released: 12/29/2015 Document Revised: 12/29/2015 Document Reviewed: 11/13/2014 Elsevier Interactive Patient Education  2017 [place].     IF you received an x-ray today, you will receive an invoice from [facility]. Please contact [facility] at [phone] with questions or concerns regarding your invoice.   IF you received labwork today, you will receive an invoice from [facility]. Please contact [facility] at [phone] with questions or concerns regarding your invoice.   Our billing staff will not be able to assist you with questions regarding bills from these companies.  You will be contacted with the lab results as soon as they are available. The fastest way to get your results is to activate your My Chart  account. Instructions are located on the last page of this paperwork. If you have not heard from [place] regarding the results in 2 weeks, please contact this office.

## 2017-08-02 NOTE — Progress Notes (Signed)
Subjective:  By signing my name below, I, Timothy Briggs, attest that this documentation has been prepared under the direction and in the presence of Timothy Agreste, MD Electronically Signed: Ladene Briggs, ED Scribe 08/02/2017 at 1:37 PM.   Patient ID: Timothy Briggs, male    DOB: December 24, 1960, 56 y.o.   MRN: [id number]  Chief Complaint  Patient presents with  . Follow-up    Persisting cough, no change.    HPI Timothy Briggs is a 56 y.o. male who presents to Primary Care at [facility] for follow-up of cough. Last seen 10/16. Cough had been present for several years, associated with congestion, itching, watery eyes. Previously took Zyrtec. Had been treated for asthma twice in the past year due to flare. Was using albuterol once per week with temporary improvement. Former smoker. Possible multifactorial cough. Started Zyrtec for allergies, Zantac for possible LPR, Flovent 44 mcg 2 puffs bid for asthma. Negative CXR last visit.   Pt states that he has noticed a hot/warm sensation with standing that radiates up from his stomach and into his chest that causes itching and coughing. He states he did not start Zyrtec or Zantac following visit. Pt has been using Flovent 2 puffs bid and has not needed albuterol since last visit. States cough has not changed. He is still having rhinorrhea and watery eyes with coughing and some sob at bed time with sleeping and prolonged talking. He does report eating a lot of spicy foods but denies drinking much coffee. Denies fever, hemoptysis, cp.  There are no active problems to display for this patient.  Past Medical History:  Diagnosis Date  . Allergy   . Asthma   . COPD (chronic obstructive pulmonary disease) ([place])    beginning of COPD along with Asthma  . Hyperlipidemia    not on medicine diet controlled   Past Surgical History:  Procedure Laterality Date  . COLONOSCOPY     in [place] 10+ yrs ago   No Known Allergies Prior to Admission medications     Medication Sig Start Date End Date Taking? Authorizing Provider  albuterol (PROVENTIL HFA;VENTOLIN HFA) 108 (90 Base) MCG/ACT inhaler Inhale 1-2 puffs into the lungs every 4 (four) hours as needed for wheezing or shortness of breath. 07/05/17  Yes Timothy Agreste, MD  fluticasone (FLOVENT HFA) 44 MCG/ACT inhaler Inhale 2 puffs into the lungs 2 (two) times daily. 07/05/17  Yes Timothy Agreste, MD   Social History   Socioeconomic History  . Marital status: Unknown    Spouse name: Not on file  . Number of children: Not on file  . Years of education: Not on file  . Highest education level: Not on file  Social Needs  . Financial resource strain: Not on file  . Food insecurity - worry: Not on file  . Food insecurity - inability: Not on file  . Transportation needs - medical: Not on file  . Transportation needs - non-medical: Not on file  Occupational History  . Not on file  Tobacco Use  . Smoking status: Former Smoker    Types: Cigarettes    Start date: 07/05/1980    Last attempt to quit: 02/03/2016    Years since quitting: 1.4  . Smokeless tobacco: Never Used  Substance and Sexual Activity  . Alcohol use: No  . Drug use: No  . Sexual activity: Not on file  Other Topics Concern  . Not on file  Social History Narrative  . Not on file  Review of Systems  Constitutional: Negative for fever.  HENT: Positive for rhinorrhea.   Eyes: Positive for discharge (watering).  Respiratory: Positive for cough and shortness of breath (at bedtime).   Cardiovascular: Negative for chest pain.      Objective:   Physical Exam  Constitutional: He is oriented to person, place, and time. He appears well-developed and well-nourished.  HENT:  Head: Normocephalic and atraumatic.  Right Ear: Tympanic membrane, external ear and ear canal normal.  Left Ear: Tympanic membrane, external ear and ear canal normal.  Nose: No rhinorrhea.  Mouth/Throat: Oropharynx is clear and moist and mucous  membranes are normal. No oropharyngeal exudate or posterior oropharyngeal erythema.  Eyes: Conjunctivae are normal. Pupils are equal, round, and reactive to light.  Neck: Neck supple.  Cardiovascular: Normal rate, regular rhythm, normal heart sounds and intact distal pulses.  No murmur heard. Pulmonary/Chest: Effort normal and breath sounds normal. He has no wheezes. He has no rhonchi. He has no rales.  Has not coughed during office visit.  Abdominal: Soft. There is no tenderness.  Lymphadenopathy:    He has no cervical adenopathy.  Neurological: He is alert and oriented to person, place, and time.  Skin: Skin is warm and dry. No rash noted.  Psychiatric: He has a normal mood and affect. His behavior is normal.  Vitals reviewed.  Vitals:   08/02/17 1328  BP: 98/70  Pulse: 76  Resp: 16  Temp: 97.8 F (36.6 C)  TempSrc: Oral  SpO2: 98%  Weight: 141 lb 12.8 oz (64.3 kg)  Height: 5\' 2"  (1.575 m)      Assessment & Plan:    Timothy Briggs is a 56 y.o. male Cough - Plan: ranitidine (ZANTAC) 150 MG tablet, cetirizine (ZYRTEC) 10 MG tablet  Heartburn - Plan: ranitidine (ZANTAC) 150 MG tablet  Allergic rhinitis, unspecified seasonality, unspecified trigger - Plan: cetirizine (ZYRTEC) 10 MG tablet  Still spends suspect some multifactorial cough, including allergies, asthma, and now with some symptoms of heartburn. Denies chest pain. Dyspnea appears to be more at nighttime, but does not report use of albuterol at that time. Vital signs reassuring, prior chest x-ray reassuring  - Continue Flovent 2 puffs twice a day, albuterol if needed for asthma symptoms  - Start Zyrtec 10 mg daily for allergies, over-the-counter Flonase nasal spray additionally if needed for allergy symptoms  - Trigger avoidance for reflux/heartburn and handout given, but also start Zantac 150 mg twice a day.  -Recheck in the next 2 weeks if not improving and can refer to pulmonary, sooner if worse. RTC/ER precautions  given   Meds ordered this encounter  Medications  . ranitidine (ZANTAC) 150 MG tablet    Sig: Take 1 tablet (150 mg total) 2 (two) times daily by mouth.    Dispense:  60 tablet    Refill:  1  . cetirizine (ZYRTEC) 10 MG tablet    Sig: Take 1 tablet (10 mg total) daily by mouth.    Dispense:  30 tablet    Refill:  3   Patient Instructions   Continue Flovent inhaler 2 puffs twice per day, albuterol if needed for wheezing or shortness of breath.  For allergies, please start her tech 1 pill once per day, Flonase nasal spray if needed for congestion.  Some of your cough may also be related to heartburn as that can cause the chest symptoms you are describing. See information below on foods to avoid, start Zantac 150 mg twice per day.  If  cough is not improved in the next 2 weeks, I can refer you to pulmonary doctor. If fevers, chest pain, worsening shortness of breath, or other worsening symptoms, return here or the emergency room sooner.  ? nng (Heartburn) ? nng l m?t lo?i ?au hay kh ch?u c th? x?y ra trong c? h?ng ho?c ng?c. N th??ng ???c m t? nh? m?t c?n ?au rt. N c?ng c th? gy ra m?t vi? ???ng trong mi?ng. ? nng c th? c?m th?y t?i t? h?n khi quy? vi? n?m xu?ng ho?c ci xu?ng, v n th??ng n?ng h?n vo ban ?m. ? nng c th? xa?y ra do ca?c th?? trong d? dy di chuy?n ng???c ln th?c qu?n (tra?o ng???c). H??NG D?N CH?M [place] T?I NH Th?c hi?n nh??ng hnh ??ng na?y ?? gi?m kh ch?u v gip trnh cc bi?n ch?ng. Ch? ?? ?n u?ng  Tun th? m?t ch? ?? ?n theo khuy?n ngh? c?a chuyn gia ch?m [place] s?c kh?e. Vi?c ny c th? l trnh cc th?c ?n v ?? u?ng nh?: ? C ph v tr (c ho?c khng c caffeine). ? ?? u?ng c ch?a r??u. ? ?? u?ng t?ng l?c v ?? u?ng dng trong th? thao. ? ?? u?ng c ga ho?c soda. ? S c la v c ca. ? B?c h v h??ng v? b?c h. ? T?i v hnh. ? C?i ng?a (Horseradish). ? Cc th?c ?n nhi?u gia v? v a xt, bao g?m h?t tiu, b?t ?t, b?t ca ri, gi?m, n??c  s?t cay, v n??c s?t barbecue. ? N??c qu? ho?c qu? h? cam qut, ch?ng h?n nh? cam, chanh v chanh l cam. ? Cc th?c ?n c c chua, nh? n??c x?t ??, ?t, n??c x?t salsa, v pizza km x?t ??. ? Th?c ?n chin v nhi?u ch?t bo, ch?ng h?n nh? bnh rn, khoai ty chin, khoai ty rn v n??c x?t nhi?u ch?t bo. ? Th?t nhi?u ch?t bo, ch?ng h?n nh? hot dog (bnh m k?p xc xch) v cc lo?i th?t ?? v tr?ng nhi?u m?, ch?ng h?n nh? th?t n?c l?ng, xc xch, gi?m bng v th?t l?n xng khi. ? Nh?ng s?n ph?m b? s?a giu ch?t bo, nh? s?a nguyn kem, b? v pho mt kem.  ?n cc b?a nh?, th??ng xuyn thay v cc b?a no.  Trnh u?ng nhi?u n??c khi qu v? ?n.  Trnh ?n trong kho?ng 2-3 gi? tr??c khi ?i ng?.  Trnh n?m xu?ng ngay sau khi ?n.  Khng t?p th? d?c ngay sau khi ?n. H??ng d?n chung  Ch  ??n nh?ng thay ??i v? tri?u ch?ng c?a qu v?.  Ch? s? d?ng thu?c khng c?n k ??n v thu?c c?n k ??n theo ch? d?n c?a chuyn gia ch?m [place] s?c kh?e. Khng dng aspirin, ibuprofen, ho?c cc thu?c NSAID khc tr? khi chuyn gia ch?m [place] s?c kh?e c?a qu v? ba?o quy? vi? du?ng.  Khng s? d?ng b?t k? s?n ph?m thu?c l no, bao g?m thu?c l d?ng ht, thu?c l d?ng nhai v thu?c l ?i?n t?. N?u qu v? c?n gip ?? ?? cai thu?c, hy h?i chuyn gia ch?m  s?c kh?e.  M?c qu?n o r?ng. Khng m?c ci g ch?t quanh eo m c th? t?o p l?c ln b?ng quy? vi?.  Nng cao (nng) ??u gi??ng c?a quy? vi? kho?ng 6 inch (15 cm).  C? g?ng gi?m c?ng th?ng, ch?ng h?n nh? t?p yoga ho?c thi?n. N?u qu v? c?n gip ?? ?? gi?m c?ng th?ng, hy  h?i chuyn gia ch?m [place] s?c kh?e.  N?u qu v? th?a cn, hy gi?m cn n?ng v? m?c c l?i cho s?c kh?e c?a qu v?. Hy h?i chuyn gia ch?m [place] s?c kh?e ?? ???c h??ng d?n v? m?c tiu gi?m cn an ton.  Tun th? t?t c? cc cu?c h?n khm l?i theo  ki?n c?a chuyn gia ch?m [place] s?c kh?e. ?i?u ny c vai tr quan tr?ng. ?I KHM N?U:  Qu v? c cc tri?u ch?ng m?i.  Qu v? b? s?t cn khng r  nguyn nhn.  Qu v? b? kh nu?t ho?c b? ?au khi nu?t.  Qu v? th? kh kh ho?c ho dai d?ng.  Cc tri?u ch?ng c?a qu v? khng c?i thi?n sau khi ???c ?i?u tr?Floyde Parkins? vi? bi? ? nng th??ng xuyn trong h?n hai tu?n.  NGAY L?P T?C ?I KHM N?U:  Qu v? b? ?au ? cnh tay, c?, hm, r?ng ho?c l?ng.  Qu v? th?y ?? m? hi, chng m?t ho?c chong vng.  Qu v? b? ?au ng?c ho?c th? d?c.  Qu v? nn v ch?t nn ra gi?ng nh? mu ho?c b c ph.  Phn c?a qu v? c mu ho?c mu ?en.  Thng tin ny khng nh?m m?c ?ch thay th? cho l?i khuyn m chuyn gia ch?m [place] s?c kh?e ni v?i qu v?. Hy b?o ??m qu v? ph?i th?o lu?n b?t k? v?n ?? g m qu v? c v?i chuyn gia ch?m [place] s?c kh?e c?a qu v?. Document Released: 12/29/2015 Document Revised: 12/29/2015 Document Reviewed: 01/01/2015 Elsevier Interactive Patient Education  2017 [place], [place]???i l??n (Cough, Adult) Ho l ph?n x? la?m sa?ch c? h?ng va? khi? qua?n cu?a quy? vi?. Ho gip ch?a lnh v b?o v? ph?i c?a quy? vi?. Thi?nh thoa?ng ho l bnh th??ng, nh?ng ho ke?m theo cc tri?u ch?ng khc ho?c ko di c th? l m?t d?u hi?u c?a m?t b?nh ly? c?n ?i?u tr?Marland Kitchen Ho c th? ko da?i ch? 2-3 tu?n (c?p tnh), ho?c n c th? ko di h?n 8 tu?n (m?n tnh). [place] th??ng la? do:  Hi?t vo cc ch?t gy kch ?ng ph?i c?a quy? vi?.  Nhi?m vi rt ho?c vi khu?n ???ng h h?p.  D? ?ng.  Hen suy?n.  Cha?y di?ch mu?i sau.  Ht thu?c l.  Axi?t tra?o ng???c t?? d? dy ln th?c qu?n (tro ng??c d? dy th??c qua?n).  M?t s? lo?i thu?c nh?t ??nh.  Nh??ng v?n ?? ph?i ma?n tnh, k? c? COPD (ho?c hi?m khi, ung th? ph?i).  Cc ti?nh tra?ng b?nh ly? kha?c ch?ng h?n nh? suy tim. H??NG D?N CH?M [place] T?I NH Ch  ??n nh?ng thay ??i v? tri?u ch?ng c?a qu v?. Ti?n hnh nh?ng hnh ??ng sau ?? gip gi?m kho? chi?u:  Ch? s? d?ng thu?c theo ch? d?n c?a chuyn gia ch?m [place] s?c kh?e cu?a quy? vi?. ? N?u qu v? ???c k thu?c khng sinh, hy  dng thu?c theo ch? d?n c?a chuyn gia ch?m [place] s?c kh?e. Khng d?ng u?ng thu?c khng sinh ngay c? khi qu v? b?t ??u c?m th?y ?? h?n. ? Ni chuy?n v?i chuyn gia ch?m  s?c kh?e tr??c khi quy? vi? du?ng m?t lo?i thu?c gia?m ho.  U?ng ?? n??c ?? gi? cho n??c ti?u trong ho?c c mu vng nh?t.  N?u khng kh kh, ha?y s? d?ng m?t ma?y h?i n???c ho??c ma?y t?o ?m trong phng ng? ho?c nha? quy? vi? ??  gip la?m l?ng di?ch ti?t ra.  Trnh b?t c? ?i?u g la?m quy? vi? ho t?i n?i lm vi?c hay ? nh.  N?u quy? vi? ho n??ng h?n vo ban ?m, hy th? ng? ?? v? tr n??a ng?i n??a n??m.  Hessie Diener khi thu?c l. N?u qu v? ht thu?c, ha?y cai thu?c. N?u qu v? c?n gip ?? ?? cai thu?c, hy h?i chuyn gia ch?m [place] s?c kh?e.  Trnh caffeine.  Trnh u?ng r??u.  Ngh? ng?i khi c?n. ?I KHM N?U:  Qu v? c cc tri?u ch?ng m?i.  Quy? vi? ho ra m?Floyde Parkins? vi? khng ??? ho sau 2-3 tu?n, ho?c quy? vi? ho n?ng h?n.  Qu v? khng th? ki?m sot c?n ho c?a mnh b?ng thu?c ho v quy? vi? b? m?t ng?.  Quy? vi? bi? ?au ma? tr?? nn n??ng h?n ho?c ?au m khng ki?m sot ????c b??ng thu?c gia?m ?au.  Qu v? b? s?t.  Qu v? b? s?t cn khng r nguyn nhn.  Qu v? ?? m? hi ban ?m.  NGAY L?P T?C ?I KHM N?U:  Qu v? ho ra mu.  Qu v? b? kh th?.  Nh?p tim c?a quy? vi? r?t nhanh.  Thng tin ny khng nh?m m?c ?ch thay th? cho l?i khuyn m chuyn gia ch?m [place] s?c kh?e ni v?i qu v?. Hy b?o ??m qu v? ph?i th?o lu?n b?t k? v?n ?? g m qu v? c v?i chuyn gia ch?m [place] s?c kh?e c?a qu v?. Document Released: 12/29/2015 Document Revised: 12/29/2015 Document Reviewed: 11/13/2014 Elsevier Interactive Patient Education  2017 [place].     IF you received an x-ray today, you will receive an invoice from [facility]. Please contact [facility] at [phone] with questions or concerns regarding your invoice.   IF you received labwork today, you will receive an invoice from  [place]. Please contact [facility] at [phone] with questions or concerns regarding your invoice.   Our billing staff will not be able to assist you with questions regarding bills from these companies.  You will be contacted with the lab results as soon as they are available. The fastest way to get your results is to activate your My Chart account. Instructions are located on the last page of this paperwork. If you have not heard from [place] regarding the results in 2 weeks, please contact this office.      I personally performed the services described in this documentation, which was scribed in my presence. The recorded information has been reviewed and considered for accuracy and completeness, addended by me as needed, and agree with information above.  Signed,   Merri Ray, MD [facility] at [place].  08/02/17 1:49 PM

## 2017-11-01 ENCOUNTER — Ambulatory Visit (INDEPENDENT_AMBULATORY_CARE_PROVIDER_SITE_OTHER): Payer: BLUE CROSS/BLUE SHIELD | Admitting: Emergency Medicine

## 2017-11-01 ENCOUNTER — Encounter: Payer: Self-pay | Admitting: Emergency Medicine

## 2017-11-01 ENCOUNTER — Other Ambulatory Visit: Payer: Self-pay | Admitting: Emergency Medicine

## 2017-11-01 ENCOUNTER — Telehealth: Payer: Self-pay | Admitting: Family Medicine

## 2017-11-01 ENCOUNTER — Other Ambulatory Visit: Payer: Self-pay

## 2017-11-01 VITALS — BP 110/72 | HR 82 | Temp 98.4°F | Resp 16 | Ht 61.0 in | Wt 139.0 lb

## 2017-11-01 DIAGNOSIS — R059 Cough, unspecified: Secondary | ICD-10-CM | POA: Insufficient documentation

## 2017-11-01 DIAGNOSIS — R05 Cough: Secondary | ICD-10-CM | POA: Diagnosis not present

## 2017-11-01 DIAGNOSIS — Z8709 Personal history of other diseases of the respiratory system: Secondary | ICD-10-CM | POA: Diagnosis not present

## 2017-11-01 DIAGNOSIS — J22 Unspecified acute lower respiratory infection: Secondary | ICD-10-CM | POA: Diagnosis not present

## 2017-11-01 MED ORDER — DM-GUAIFENESIN ER 30-600 MG PO TB12: 1.0000 | ORAL_TABLET | Freq: Two times a day (BID) | ORAL | 1 refills | Status: AC

## 2017-11-01 MED ORDER — PREDNISONE 20 MG PO TABS: 40.0000 mg | ORAL_TABLET | Freq: Every day | ORAL | 0 refills | Status: AC

## 2017-11-01 MED ORDER — AZITHROMYCIN 250 MG PO TABS: ORAL_TABLET | ORAL | 0 refills | Status: DC

## 2017-11-01 MED ORDER — BENZONATATE 200 MG PO CAPS: 200.0000 mg | ORAL_CAPSULE | Freq: Two times a day (BID) | ORAL | 0 refills | Status: DC | PRN

## 2017-11-01 NOTE — Patient Instructions (Addendum)
     IF you received an x-ray today, you will receive an invoice from [facility]. Please contact  Radiology at [phone] with questions or concerns regarding your invoice.   IF you received labwork today, you will receive an invoice from [facility]. Please contact [facility] at [phone] with questions or concerns regarding your invoice.   Our billing staff will not be able to assist you with questions regarding bills from these companies.  You will be contacted with the lab results as soon as they are available. The fastest way to get your results is to activate your My Chart account. Instructions are located on the last page of this paperwork. If you have not heard from us regarding the results in 2 weeks, please contact this office.     Cough, Adult A cough helps to clear your throat and lungs. A cough may last only 2-3 weeks (acute), or it may last longer than 8 weeks (chronic). Many different things can cause a cough. A cough may be a sign of an illness or another medical condition. Follow these instructions at home:  Pay attention to any changes in your cough.  Take medicines only as told by your doctor. ? If you were prescribed an antibiotic medicine, take it as told by your doctor. Do not stop taking it even if you start to feel better. ? Talk with your doctor before you try using a cough medicine.  Drink enough fluid to keep your pee (urine) clear or pale yellow.  If the air is dry, use a cold steam vaporizer or humidifier in your home.  Stay away from things that make you cough at work or at home.  If your cough is worse at night, try using extra pillows to raise your head up higher while you sleep.  Do not smoke, and try not to be around smoke. If you need help quitting, ask your doctor.  Do not have caffeine.  Do not drink alcohol.  Rest as needed. Contact a doctor if:  You have new problems (symptoms).  You cough up yellow fluid  (pus).  Your cough does not get better after 2-3 weeks, or your cough gets worse.  Medicine does not help your cough and you are not sleeping well.  You have pain that gets worse or pain that is not helped with medicine.  You have a fever.  You are losing weight and you do not know why.  You have night sweats. Get help right away if:  You cough up blood.  You have trouble breathing.  Your heartbeat is very fast. This information is not intended to replace advice given to you by your health care provider. Make sure you discuss any questions you have with your health care provider. Document Released: 05/20/2011 Document Revised: 02/12/2016 Document Reviewed: 11/13/2014 Elsevier Interactive Patient Education  2018 Elsevier Inc.  

## 2017-11-01 NOTE — Telephone Encounter (Signed)
Tessalon prescribed. Thanks.

## 2017-11-01 NOTE — Progress Notes (Signed)
Timothy Briggs 57 y.o.   Chief Complaint  Patient presents with  . Cough    productive with dark yellow mucus x 1 week  . Nasal Congestion    runny nose    HISTORY OF PRESENT ILLNESS: This is a 57 y.o. male complaining of one-week history of flulike symptoms.  Complaining of productive cough, chest and nose congestion.  Non-smoker.  Has a history of asthma.  Uses albuterol inhaler as needed.  No other significant symptomatology.  No one else sick at home.  Cough  This is a new problem. The current episode started in the past 7 days. The problem has been gradually worsening. The problem occurs every few minutes. The cough is productive of sputum. Associated symptoms include nasal congestion and wheezing. Pertinent negatives include no chest pain, chills, fever, headaches, heartburn, hemoptysis, rash, sore throat or shortness of breath. He has tried nothing for the symptoms. His past medical history is significant for asthma.     Prior to Admission medications   Medication Sig Start Date End Date Taking? Authorizing Provider  albuterol (PROVENTIL HFA;VENTOLIN HFA) 108 (90 Base) MCG/ACT inhaler Inhale 1-2 puffs into the lungs every 4 (four) hours as needed for wheezing or shortness of breath. 07/05/17  Yes [name], MD  cetirizine (ZYRTEC) 10 MG tablet Take 1 tablet (10 mg total) daily by mouth. 08/02/17  Yes [name], MD  fluticasone (FLOVENT HFA) 44 MCG/ACT inhaler Inhale 2 puffs into the lungs 2 (two) times daily. 07/05/17  Yes [name], MD  ranitidine (ZANTAC) 150 MG tablet Take 1 tablet (150 mg total) 2 (two) times daily by mouth. 08/02/17  Yes [name], MD    No Known Allergies  There are no active problems to display for this patient.   Past Medical History:  Diagnosis Date  . Allergy   . Asthma   . COPD (chronic obstructive pulmonary disease) ([place])    beginning of COPD along with Asthma  . Hyperlipidemia    not on medicine diet controlled     Past Surgical History:  Procedure Laterality Date  . COLONOSCOPY     in [place] 10+ yrs ago    Social History   Socioeconomic History  . Marital status: Unknown    Spouse name: Not on file  . Number of children: Not on file  . Years of education: Not on file  . Highest education level: Not on file  Social Needs  . Financial resource strain: Not on file  . Food insecurity - worry: Not on file  . Food insecurity - inability: Not on file  . Transportation needs - medical: Not on file  . Transportation needs - non-medical: Not on file  Occupational History  . Not on file  Tobacco Use  . Smoking status: Former Smoker    Types: Cigarettes    Start date: 07/05/1980    Last attempt to quit: 02/03/2016    Years since quitting: 1.7  . Smokeless tobacco: Never Used  Substance and Sexual Activity  . Alcohol use: No  . Drug use: No  . Sexual activity: Not on file  Other Topics Concern  . Not on file  Social History Narrative  . Not on file    Family History  Problem Relation Age of Onset  . Colon cancer Neg Hx   . Colon polyps Neg Hx   . Esophageal cancer Neg Hx   . Rectal cancer Neg Hx   . Stomach cancer  Neg Hx      Review of Systems  Constitutional: Negative.  Negative for chills, fever and malaise/fatigue.  HENT: Negative.  Negative for congestion, sinus pain and sore throat.   Eyes: Negative.  Negative for blurred vision, double vision and discharge.  Respiratory: Positive for cough and wheezing. Negative for hemoptysis and shortness of breath.   Cardiovascular: Negative for chest pain, palpitations and leg swelling.  Gastrointestinal: Negative for abdominal pain, heartburn, nausea and vomiting.  Genitourinary: Negative.  Negative for dysuria and hematuria.  Skin: Negative.  Negative for rash.  Neurological: Negative.  Negative for dizziness, sensory change and headaches.  Endo/Heme/Allergies: Negative.   All other systems reviewed and are negative.      Vitals:   11/01/17 0858  BP: 110/72  Pulse: 82  Resp: 16  Temp: 98.4 F (36.9 C)  SpO2: 98%     Physical Exam  Constitutional: He is oriented to person, place, and time. He appears well-developed and well-nourished.  HENT:  Head: Normocephalic and atraumatic.  Right Ear: External ear normal.  Left Ear: External ear normal.  Nose: Nose normal.  Mouth/Throat: Oropharynx is clear and moist.  Eyes: Conjunctivae and EOM are normal. Pupils are equal, round, and reactive to light.  Neck: Normal range of motion. Neck supple. No JVD present.  Cardiovascular: Normal rate, regular rhythm and normal heart sounds.  Pulmonary/Chest: Effort normal and breath sounds normal. No respiratory distress.  Abdominal: Soft. Bowel sounds are normal. He exhibits no distension. There is no tenderness.  Musculoskeletal: Normal range of motion.  Lymphadenopathy:    He has no cervical adenopathy.  Neurological: He is alert and oriented to person, place, and time. No sensory deficit. He exhibits normal muscle tone.  Skin: Skin is warm and dry. Capillary refill takes less than 2 seconds. No rash noted.  Psychiatric: He has a normal mood and affect. His behavior is normal.  Vitals reviewed.    ASSESSMENT & PLAN: Timothy Briggs was seen today for cough and nasal congestion.  Diagnoses and all orders for this visit:  Cough -     dextromethorphan-guaiFENesin (MUCINEX DM) 30-600 MG 12hr tablet; Take 1 tablet by mouth 2 (two) times daily for 7 days.  Lower respiratory infection -     azithromycin (ZITHROMAX) 250 MG tablet; Sig as indicated  History of asthma -     predniSONE (DELTASONE) 20 MG tablet; Take 2 tablets (40 mg total) by mouth daily with breakfast for 5 days.    Patient Instructions       IF you received an x-ray today, you will receive an invoice from [facility]. Please contact [facility] at [phone] with questions or concerns regarding your invoice.   IF you received  labwork today, you will receive an invoice from [facility]. Please contact [facility] at [phone] with questions or concerns regarding your invoice.   Our billing staff will not be able to assist you with questions regarding bills from these companies.  You will be contacted with the lab results as soon as they are available. The fastest way to get your results is to activate your My Chart account. Instructions are located on the last page of this paperwork. If you have not heard from [place] regarding the results in 2 weeks, please contact this office.     Cough, Adult A cough helps to clear your throat and lungs. A cough may last only 2-3 weeks (acute), or it may last longer than 8 weeks (chronic). Many different things can cause  a cough. A cough may be a sign of an illness or another medical condition. Follow these instructions at home:  Pay attention to any changes in your cough.  Take medicines only as told by your doctor. ? If you were prescribed an antibiotic medicine, take it as told by your doctor. Do not stop taking it even if you start to feel better. ? Talk with your doctor before you try using a cough medicine.  Drink enough fluid to keep your pee (urine) clear or pale yellow.  If the air is dry, use a cold steam vaporizer or humidifier in your home.  Stay away from things that make you cough at work or at home.  If your cough is worse at night, try using extra pillows to raise your head up higher while you sleep.  Do not smoke, and try not to be around smoke. If you need help quitting, ask your doctor.  Do not have caffeine.  Do not drink alcohol.  Rest as needed. Contact a doctor if:  You have new problems (symptoms).  You cough up yellow fluid (pus).  Your cough does not get better after 2-3 weeks, or your cough gets worse.  Medicine does not help your cough and you are not sleeping well.  You have pain that gets worse or pain that is not helped with  medicine.  You have a fever.  You are losing weight and you do not know why.  You have night sweats. Get help right away if:  You cough up blood.  You have trouble breathing.  Your heartbeat is very fast. This information is not intended to replace advice given to you by your health care provider. Make sure you discuss any questions you have with your health care provider. Document Released: 05/20/2011 Document Revised: 02/12/2016 Document Reviewed: 11/13/2014 Elsevier Interactive Patient Education  2018 Elsevier Inc.      Agustina Caroli, MD [facility]

## 2017-11-01 NOTE — Telephone Encounter (Signed)
Copied from [place] [id number]. Topic: Quick Communication - See Telephone Encounter >> Nov 01, 2017  9:54 AM Conception Chancy, NT wrote: CRM for notification. See Telephone encounter for:  11/01/17.  Pt was seen today and was prescribed mucinex Dm but states that he took over the counter mucinex DM last week and it did not help so is requesting something else called in instead. Please advise.

## 2017-11-01 NOTE — Telephone Encounter (Signed)
Pt notified and verbalized understanding.

## 2017-11-01 NOTE — Telephone Encounter (Signed)
I did not see this patient for cough. Routed to Dr. Mitchel Honour.

## 2018-01-02 ENCOUNTER — Other Ambulatory Visit: Payer: Self-pay | Admitting: Emergency Medicine

## 2018-01-02 ENCOUNTER — Other Ambulatory Visit: Payer: Self-pay | Admitting: Family Medicine

## 2018-01-02 DIAGNOSIS — Z8709 Personal history of other diseases of the respiratory system: Secondary | ICD-10-CM

## 2018-01-02 DIAGNOSIS — J4520 Mild intermittent asthma, uncomplicated: Secondary | ICD-10-CM

## 2018-01-03 ENCOUNTER — Other Ambulatory Visit: Payer: Self-pay

## 2018-01-03 ENCOUNTER — Ambulatory Visit: Payer: BLUE CROSS/BLUE SHIELD | Admitting: Family Medicine

## 2018-01-03 ENCOUNTER — Encounter: Payer: Self-pay | Admitting: Family Medicine

## 2018-01-03 VITALS — BP 102/62 | HR 79 | Temp 98.4°F | Ht 61.81 in | Wt 141.0 lb

## 2018-01-03 DIAGNOSIS — J4520 Mild intermittent asthma, uncomplicated: Secondary | ICD-10-CM | POA: Diagnosis not present

## 2018-01-03 DIAGNOSIS — J302 Other seasonal allergic rhinitis: Secondary | ICD-10-CM

## 2018-01-03 MED ORDER — MONTELUKAST SODIUM 10 MG PO TABS: 10.0000 mg | ORAL_TABLET | Freq: Every day | ORAL | 3 refills | Status: DC

## 2018-01-03 MED ORDER — PREDNISONE 20 MG PO TABS: 40.0000 mg | ORAL_TABLET | Freq: Every day | ORAL | 0 refills | Status: AC

## 2018-01-03 MED ORDER — CETIRIZINE HCL 10 MG PO TABS: 10.0000 mg | ORAL_TABLET | Freq: Every day | ORAL | 3 refills | Status: DC

## 2018-01-03 MED ORDER — ALBUTEROL SULFATE HFA 108 (90 BASE) MCG/ACT IN AERS: 1.0000 | INHALATION_SPRAY | RESPIRATORY_TRACT | 3 refills | Status: DC | PRN

## 2018-01-03 NOTE — Progress Notes (Signed)
4/16/20192:17 PM  Timothy Briggs 1960-12-21, 57 y.o. male [id number]  Chief Complaint  Patient presents with  . Wheezing    was given Prednisone in the past for wheezing, this works best during allergy season. Requesting that medication and zyrtec    HPI:   Patient is a 57 y.o. male with past medical history significant for asthma induced by seasonal allergies who presents today for 2 days of nocturnal coughing, wheezing, chest tightness. Last asthma attack, May 2017. Has never been hospitalized or intubated Has been taking zyrtec daily. Denies any nasal congestion, rhinorrhea, PND Has not been using flovent, denies any asthma sx until 2 days ago Does report coughing fits while at work, spring and fall He denies any fever, chills, sputum production Quit smoking in 2017  Depression screen [facility] 2/9 01/03/2018 11/01/2017 08/02/2017  Decreased Interest 0 0 0  Down, Depressed, Hopeless 0 0 0  PHQ - 2 Score 0 0 0    No Known Allergies  Prior to Admission medications   Medication Sig Start Date End Date Taking? Authorizing Provider  albuterol (PROVENTIL HFA;VENTOLIN HFA) 108 (90 Base) MCG/ACT inhaler Inhale 1-2 puffs into the lungs every 4 (four) hours as needed for wheezing or shortness of breath. 07/05/17  Yes [name], MD  cetirizine (ZYRTEC) 10 MG tablet Take 1 tablet (10 mg total) daily by mouth. 08/02/17  Yes [name], MD  fluticasone (FLOVENT HFA) 44 MCG/ACT inhaler Inhale 2 puffs into the lungs 2 (two) times daily. 07/05/17  Yes [name], MD  ranitidine (ZANTAC) 150 MG tablet Take 1 tablet (150 mg total) 2 (two) times daily by mouth. 08/02/17  Yes [name], MD    Past Medical History:  Diagnosis Date  . Allergy   . Asthma   . COPD (chronic obstructive pulmonary disease) ([place])    beginning of COPD along with Asthma  . Hyperlipidemia    not on medicine diet controlled    Past Surgical History:  Procedure Laterality Date  . COLONOSCOPY      in [place] 10+ yrs ago    Social History   Tobacco Use  . Smoking status: Former Smoker    Types: Cigarettes    Start date: 07/05/1980    Last attempt to quit: 02/03/2016    Years since quitting: 1.9  . Smokeless tobacco: Never Used  Substance Use Topics  . Alcohol use: No    Family History  Problem Relation Age of Onset  . Colon cancer Neg Hx   . Colon polyps Neg Hx   . Esophageal cancer Neg Hx   . Rectal cancer Neg Hx   . Stomach cancer Neg Hx     ROS Per hpi  OBJECTIVE:  Blood pressure 102/62, pulse 79, temperature 98.4 F (36.9 C), temperature source Oral, height 5' 1.81" (1.57 m), weight 141 lb (64 kg), SpO2 96 %.  Physical Exam  Constitutional: He is oriented to person, place, and time. He appears well-developed and well-nourished.  HENT:  Head: Normocephalic and atraumatic.  Right Ear: Hearing, tympanic membrane, external ear and ear canal normal.  Left Ear: Hearing, tympanic membrane, external ear and ear canal normal.  Mouth/Throat: Oropharynx is clear and moist. No oropharyngeal exudate.  Eyes: Pupils are equal, round, and reactive to light. Conjunctivae and EOM are normal.  Neck: Neck supple.  Cardiovascular: Normal rate and regular rhythm. Exam reveals no gallop and no friction rub.  No murmur heard. Pulmonary/Chest: Effort normal and breath sounds  normal. He has no wheezes. He has no rales.  Lymphadenopathy:    He has no cervical adenopathy.  Neurological: He is alert and oriented to person, place, and time.  Skin: Skin is warm and dry.      ASSESSMENT and PLAN  1. Seasonal allergies 2. Mild intermittent asthma, unspecified whether complicated Discussed supportive measures, new meds r/se/b and RTC precautions. Patient educational handout given. - albuterol (PROVENTIL HFA;VENTOLIN HFA) 108 (90 Base) MCG/ACT inhaler; Inhale 1-2 puffs into the lungs every 4 (four) hours as needed for wheezing or shortness of breath.  Other orders -  predniSONE (DELTASONE) 20 MG tablet; Take 2 tablets (40 mg total) by mouth daily with breakfast for 5 days. - montelukast (SINGULAIR) 10 MG tablet; Take 1 tablet (10 mg total) by mouth at bedtime. - cetirizine (ZYRTEC) 10mg  tablet; Take 1 tablet (10 mg total) by mouth daily  Return in about 1 month (around 01/31/2018).    Rutherford Guys, MD [facility] at [facility], [place] [address] Ph.  [phone] Fax [phone]

## 2018-01-03 NOTE — Patient Instructions (Addendum)
IF you received an x-ray today, you will receive an invoice from [facility]. Please contact [facility] at [phone] with questions or concerns regarding your invoice.   IF you received labwork today, you will receive an invoice from [facility]. Please contact [facility] at [phone] with questions or concerns regarding your invoice.   Our billing staff will not be able to assist you with questions regarding bills from these companies.  You will be contacted with the lab results as soon as they are available. The fastest way to get your results is to activate your My Chart account. Instructions are located on the last page of this paperwork. If you have not heard from [place] regarding the results in 2 weeks, please contact this office.     Phng ng?a c?n hen suy?n, Ng??i l?n Asthma Attack Prevention, Adult M?c d qu v? c th? khng c kh? n?ng ki?m sot th?c t? l qu v? b? hen suy?n, nh?ng qu v? c th? th?c hi?n cc hnh ??ng ?? phng ng?a cc ??t hen suy?n (c?n hen suy?n). Cc hnh ??ng ny bao g?m:  T?o m?t k? ho?ch trn gi?y ?? qu?n l v ?i?u tr? cc c?n hen suy?n c?a qu v? (k? ho?ch hnh ??ng v?i hen suy?n).  Theo di b?nh hen suy?n c?a b?n thn.  Trnh nh?ng th? c th? kch thch ???ng th? ho?c khi?n cc tri?u ch?ng hen suy?n c?a qu v? tr?m tr?ng h?n (tc nhn gy kh?i pht hen suy?n).  Dng thu?c theo ch? d?n.  Hnh ??ng nhanh chng n?u qu v? c d?u hi?u ho?c tri?u ch?ng c?a m?t c?n hen suy?n.  M?t s? cch ?? phng ng?a c?n hen suy?n l g? L?p m?t k? ho?ch Lm vi?c v?i chuyn gia ch?m [place] s?c kh?e ?? l?p m?t k? ho?ch hnh ??ng v?i hen suy?n. K? ho?ch ny nn bao g?m:  M?t danh sch cc tc nhn gy kh?i pht hen suy?n v cch c th? phng trnh cc tc nhn ?Marland Kitchen  M?t danh sch cc tri?u ch?ng m qu v? g?p ph?i trong m?t c?n hen suy?n.  Thng tin v? vi?c khi no th dng thu?c v l??ng thu?c c?n dng.  Thng tin gip qu v? hi?u cc k?t qu? ?o  l?u l??ng ??nh c?a mnh.  Thng tin lin h? v?i cc chuyn gia ch?m [place] s?c kh?e c?a qu v?.  Cc hnh ??ng hng ngy m qu v? c th? th?c hi?n ?? ki?m sot hen suy?n.  Theo di b?nh hen suy?n c?a b?n thn  ?? theo di b?nh hen suy?n c?a b?n thn:  S? d?ng l?u l??ng ??nh k? m?i sng v m?i t?i trong 2-3 tu?n. Ghi l?i k?t qu? vo m?t cu?n nh?t k. Gi?m k?t qu? l?u l??ng ??nh trong m?t ho?c nhi?u ngy c th? ngh?a l qu v? ?ang b?t ??u c m?t c?n hen suy?n, ngay c? khi qu v? khng c tri?u ch?ng.  Khi c tri?u ch?ng hen suy?n, hy ghi chng vo m?t cu?n nh?t k.  Hessie Diener cc tc nhn gy kh?i pht hen suy?n  Lm vi?c v?i chuyn gia ch?m [place] s?c kh?e ?? bi?t cc tc nhn gy kh?i pht hen suy?n l g. Vi?c ny c th? ???c th?c hi?n b?ng cch:  ???c ki?m tra ?? tm di? ??ng.  Gi? m?t cu?n nh?t k ghi l?i khi no cc c?n hen suy?n x?y ra v nh?ng g c th? ? gp ph?n gy ra c?n hen.  H?i  chuyn gia ch?m [place] s?c kh?e c?a qu v? xem cc tnh tr?ng b?nh l khc c khi?n b?nh hen suy?n c?a qu v? tr?m tr?ng h?n hay khng.  Cc tc nhn gy kh?i pht hen suy?n th??ng g?p bao g?m:  B?i.  Khi thu?c. Khi bao g?m khi l?a tr?i v khi thu?c th? ??ng t? cc s?n ph?m thu?c l.  V?y da v?t nui.  Cy c?i, c? d?i ho?c ph?n hoa.  Khng kh l?nh, kh ho?c ?m ??t.  N?m m?c.  Th?c ?n c hm l??ng sulfit cao.  Cc mi m?nh.  Kh th?i c?a ??ng c? v  nhi?m khng kh.  Cc s?n ph?m x?t sol kh v kh c mi t? cc s?n ph?m t?y r?a gia d?ng.  V?t nui gia ?nh v lng, phn c?a v?t nui, bao g?m ve b?i v gin.  M?t s? lo?i thu?c nh?t ??nh, bao g?m c? NSAID.  Khi ? xc ??nh ???c cc tc nhn gy kh?i pht hen suy?n c?a mnh, hy th?c hi?n cc b??c ?? trnh Anthonette Legato. Ty thu?c vo cc tc nhn gy kh?i pht c?a mnh, qu v? c th? gi?m c? h?i ln c?n hen suy?n b?ng cch:  Gi? nh c?a s?ch s?. ?? ai ? qut d?n v ht b?i nh c?a qu v? cho qu v? 1 ho?c 2 l?n m?t tu?n. N?u c th?, s? d?ng  my ht b?i gi? h?t hi?u qu? cao (HEPA).  Gi?t ga tr?i gi??ng hng tu?n b?ng n??c nng.  S? d?ng o n?m v ga b?c ch?ng d? ?ng trn gi??ng.  ?? v?t nui ? ngoi nh ?Marland Kitchen  L?u tm ??n cc v?n ?? v? n?m m?c v n??c trong nh.  Trnh cc khu v?c c ng??i ht thu?c.  Trnh s? d?ng n??c hoa c mi m?nh ho?c s?n ph?m x?t c mi.  Trnh dnh nhi?u th?i gian ngoi tr?i khi m?t ?? ph?n hoa cao v trong nh?ng ngy c gi m?nh.  Trao ??i v?i chuyn gia ch?m [place] s?c kh?e c?a qu v? tr??c khi b?t ??u ho?c ng?ng b?t k? lo?i thu?c m?i no.  Thu?c Ch? s? d?ng thu?c khng k ??n v thu?c k ??n theo ch? d?n c?a chuyn gia ch?m [place] s?c kh?e. Nhi?u c?n hen suy?n c th? ???c ng?n ng?a b?ng cch tun th? c?n th?n l?ch trnh dng thu?c c?a qu v?. Dng thu?c ?ng cch l vi?c ??c bi?t quan tr?ng khi qu v? khng th? trnh m?t s? tc nhn gy kh?i pht hen suy?n nh?t ??nh. Ngay c? n?u qu v? ?ang kh?e, khng ???c ng?ng dng thu?c v khng ???c dng t thu?c h?n. Hnh ??ng nhanh N?u c?n hen suy?n x?y ra, hnh ??ng nhanh c th? lm gi?m m?c ?? n?ng v th?i gian ko di c?a n. Th?c hi?n nh?ng hnh ??ng ny:  Ch  ??n cc tri?u ch?ng c?a qu v?. N?u qu v? ho, th? kh kh ho?c kh th?, khng ???c ch? ?? xem cc tri?u ch?ng c t? h?t hay khng. Lm theo k? ho?ch hnh ??ng v?i hen suy?n c?a qu v?.  N?u qu v? ? lm theo k? ho?ch hnh ??ng v?i hen suy?n v cc tri?u ch?ng khng c?i thi?n, hy g?i cho chuyn gia ch?m [place] s?c kh?e ho?c ?i khm ngay l?p t?c t?i b?nh vi?n g?n nh?t.  ?i?u quan tr?ng l ghi l?i t?n su?t qu v? c?n s? d?ng ?ng ht c?p c?u tc d?ng nhanh c?a mnh. Qu v? c th? theo  di t?n su?t s? d?ng ?ng ht trong cu?n nh?t k. N?u qu v? ?ang s? d?ng ?ng ht c?p c?u th??ng xuyn h?n, ?i?u ? c th? ngh?a l b?nh hen suy?n c?a qu v? ?ang ngoi t?m ki?m sot. ?i?u ch?nh k? ho?ch ?i?u tr? hen suy?n c th? gip qu v? ng?n ng?a cc c?n hen suy?n trong t??ng lai v gip qu v? ki?m sot t?t h?n tnh tr?ng  c?a mnh. Ti c th? ng?n ng?a c?n hen suy?n b?ng cch no khi ti t?p th? d?c?  T?p th? d?c l m?t tc nhn gy kh?i pht hen suy?n th??ng g?p. ?? ng?n x?y ra c?n hen suy?n trong khi t?p th? d?c:  Lm theo khuy?n ngh? c?a chuyn gia ch?m [place] s?c kh?e v? vi?c khi no qu v? nn dng ?ng ht tc d?ng nhanh tr??c khi t?p th? d?c. Nhi?u ng??i b? hen suy?n b? tnh tr?ng co th?t ph? qu?n do t?p th? d?c gy ra (EIB). Tnh tr?ng ny th??ng t?i t? h?n trong khi t?p th? d?c v?i c??ng ?? m?nh trong mi tr??ng l?nh, ?m ho?c kh. Thng th??ng, nh?ng ng??i b? EIB c th? t?p r?t tch c?c b?ng cch s? d?ng ?ng ht tc d?ng nhanh tr??c khi t?p th? d?c.  Trnh t?p th? d?c ngoi tr?i trong th?i ti?t qu l?nh ho?c ?m.  Trnh t?p th? d?c ngoi tr?i khi l??ng ph?n hoa cao.  Kh?i ??ng v th? l?ng khi t?p th? d?c.  Ng?ng t?p ngay n?u tri?u ch?ng hen suy?n kh?i pht.  Cn nh?c tham gia cc bi t?p t c kh? n?ng gy ra cc tri?u ch?ng hen suy?n nh? l:  B?i trong nh.  ??p xe.  ?i b?.  ?i b? ???ng di.  Ch?i bng ?.  Thng tin ny khng nh?m m?c ?ch thay th? cho l?i khuyn m chuyn gia ch?m [place] s?c kh?e ni v?i qu v?. Hy b?o ??m qu v? ph?i th?o lu?n b?t k? v?n ?? g m qu v? c v?i chuyn gia ch?m [place] s?c kh?e c?a qu v?. Document Released: 12/10/2016 Document Revised: 12/10/2016 Elsevier Interactive Patient Education  [employer].

## 2018-09-04 ENCOUNTER — Ambulatory Visit: Payer: Self-pay | Admitting: *Deleted

## 2018-09-04 NOTE — Telephone Encounter (Signed)
Pt reports dizziness, onset yesterday. Reports positional, "When I lie down and stand up." States room spins and "I can't see anything." Duration 1 minute. States two episodes, first last night and again this morning; states "Almost fell."  Pt presently at work.  States worse with turning head at times. Denies headache, weakness, no cold symptoms or earache. Reports is staying hydrated, does not check blood pressure, no BP medications. Directed pt to UC as no availability at practice within disposition time frame of 4 hours. Care advise given per protocol.   Reason for Disposition . [1] MODERATE dizziness (e.g., vertigo; feels very unsteady, interferes with normal activities) AND [2] has NOT been evaluated by physician for this    With visual changes  Answer Assessment - Initial Assessment Questions 1. DESCRIPTION: "Describe your dizziness."    Spinning, positional 2. VERTIGO: "Do you feel like either you or the room is spinning or tilting?"      yes 3. LIGHTHEADED: "Do you feel lightheaded?" (e.g., somewhat faint, woozy, weak upon standing)     Yes 4. SEVERITY: "How bad is it?"  "Can you walk?"   - MILD - Feels unsteady but walking normally.   - MODERATE - Feels very unsteady when walking, but not falling; interferes with normal activities (e.g., school, work) .   - SEVERE - Unable to walk without falling (requires assistance).     Moderate this am, severe last night 5. ONSET:  "When did the dizziness begin?"     yesterday 6. AGGRAVATING FACTORS: "Does anything make it worse?" (e.g., standing, change in head position)     positional 7. CAUSE: "What do you think is causing the dizziness?"     unsure 8. RECURRENT SYMPTOM: "Have you had dizziness before?" If so, ask: "When was the last time?" "What happened that time?"     This am 9. OTHER SYMPTOMS: "Do you have any other symptoms?" (e.g., headache, weakness, numbness, vomiting, earache)     Visual changes, "Could not see  anything."  Protocols used: DIZZINESS - VERTIGO-A-AH

## 2019-02-13 ENCOUNTER — Other Ambulatory Visit: Payer: Self-pay | Admitting: Family Medicine

## 2019-02-13 DIAGNOSIS — J302 Other seasonal allergic rhinitis: Secondary | ICD-10-CM

## 2019-03-25 IMAGING — DX DG CHEST 2V
2 series · 2 of 2 positions shown · non-contrast
Comparison: None

CLINICAL DATA: Cough.  Former smoker.

EXAM:
CHEST  2 VIEW

[chest pa]
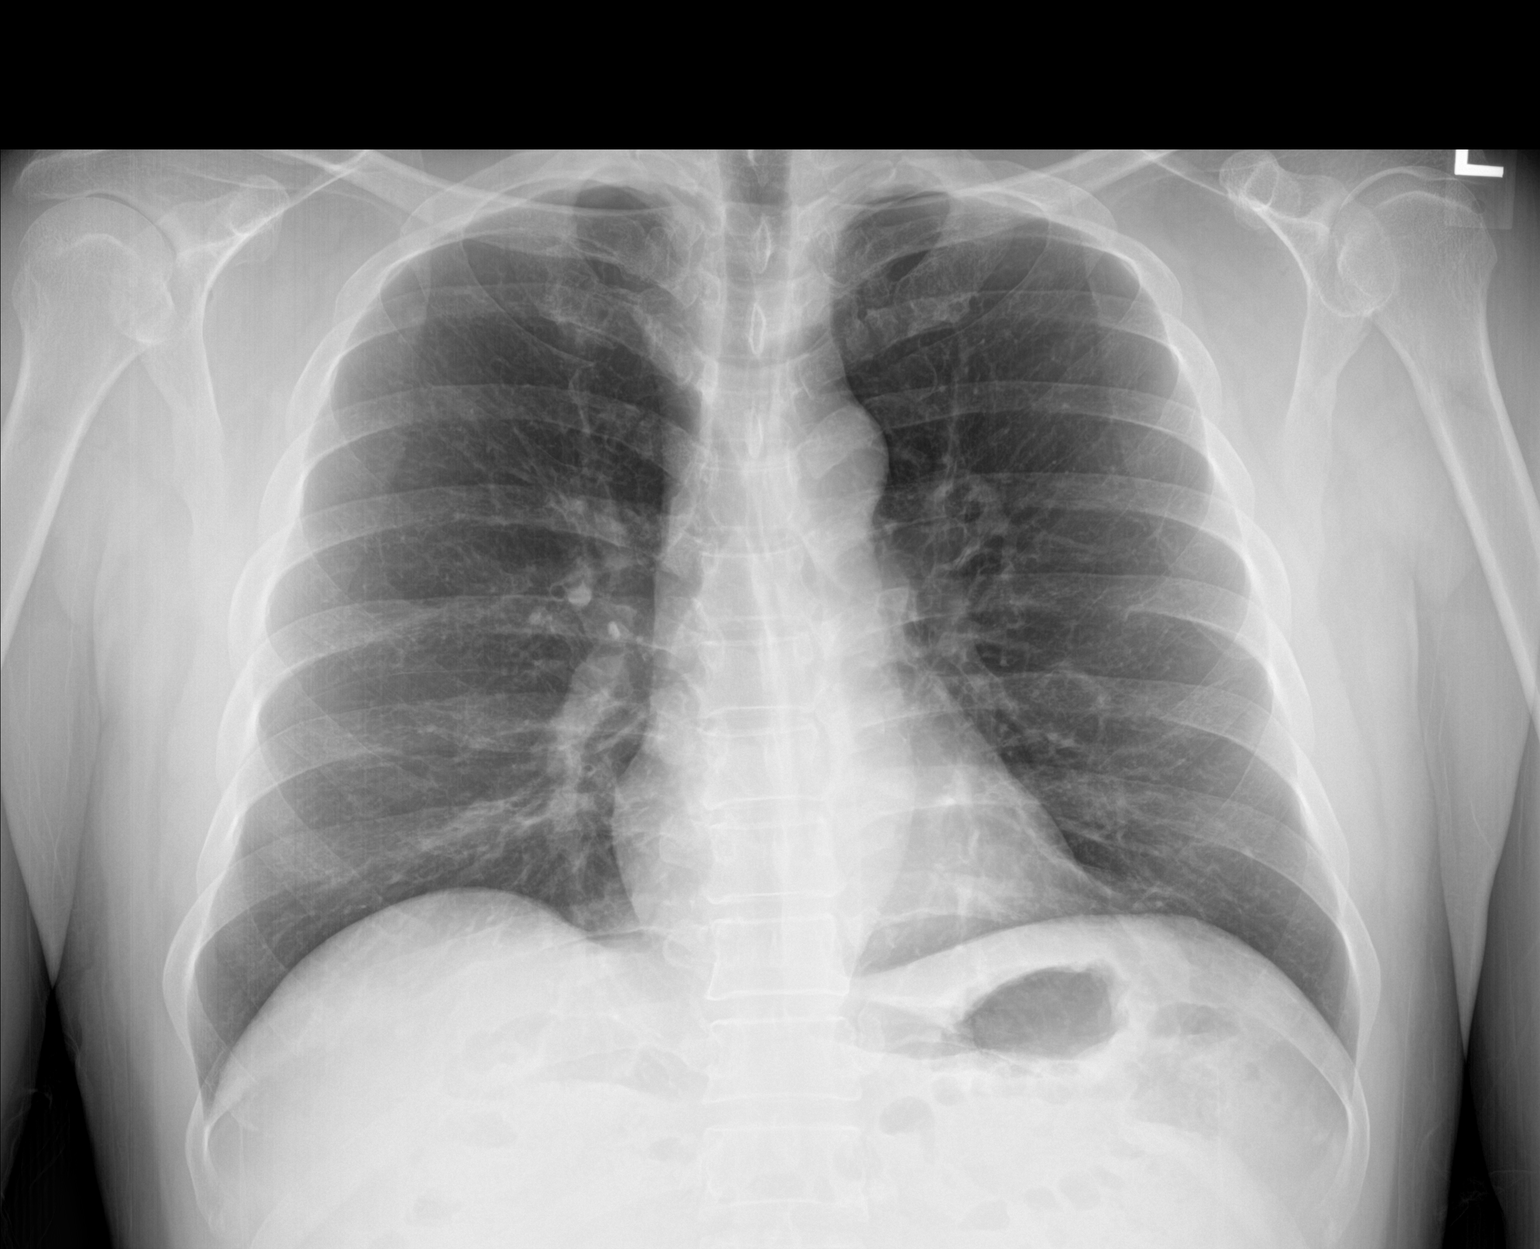

[chest lat]
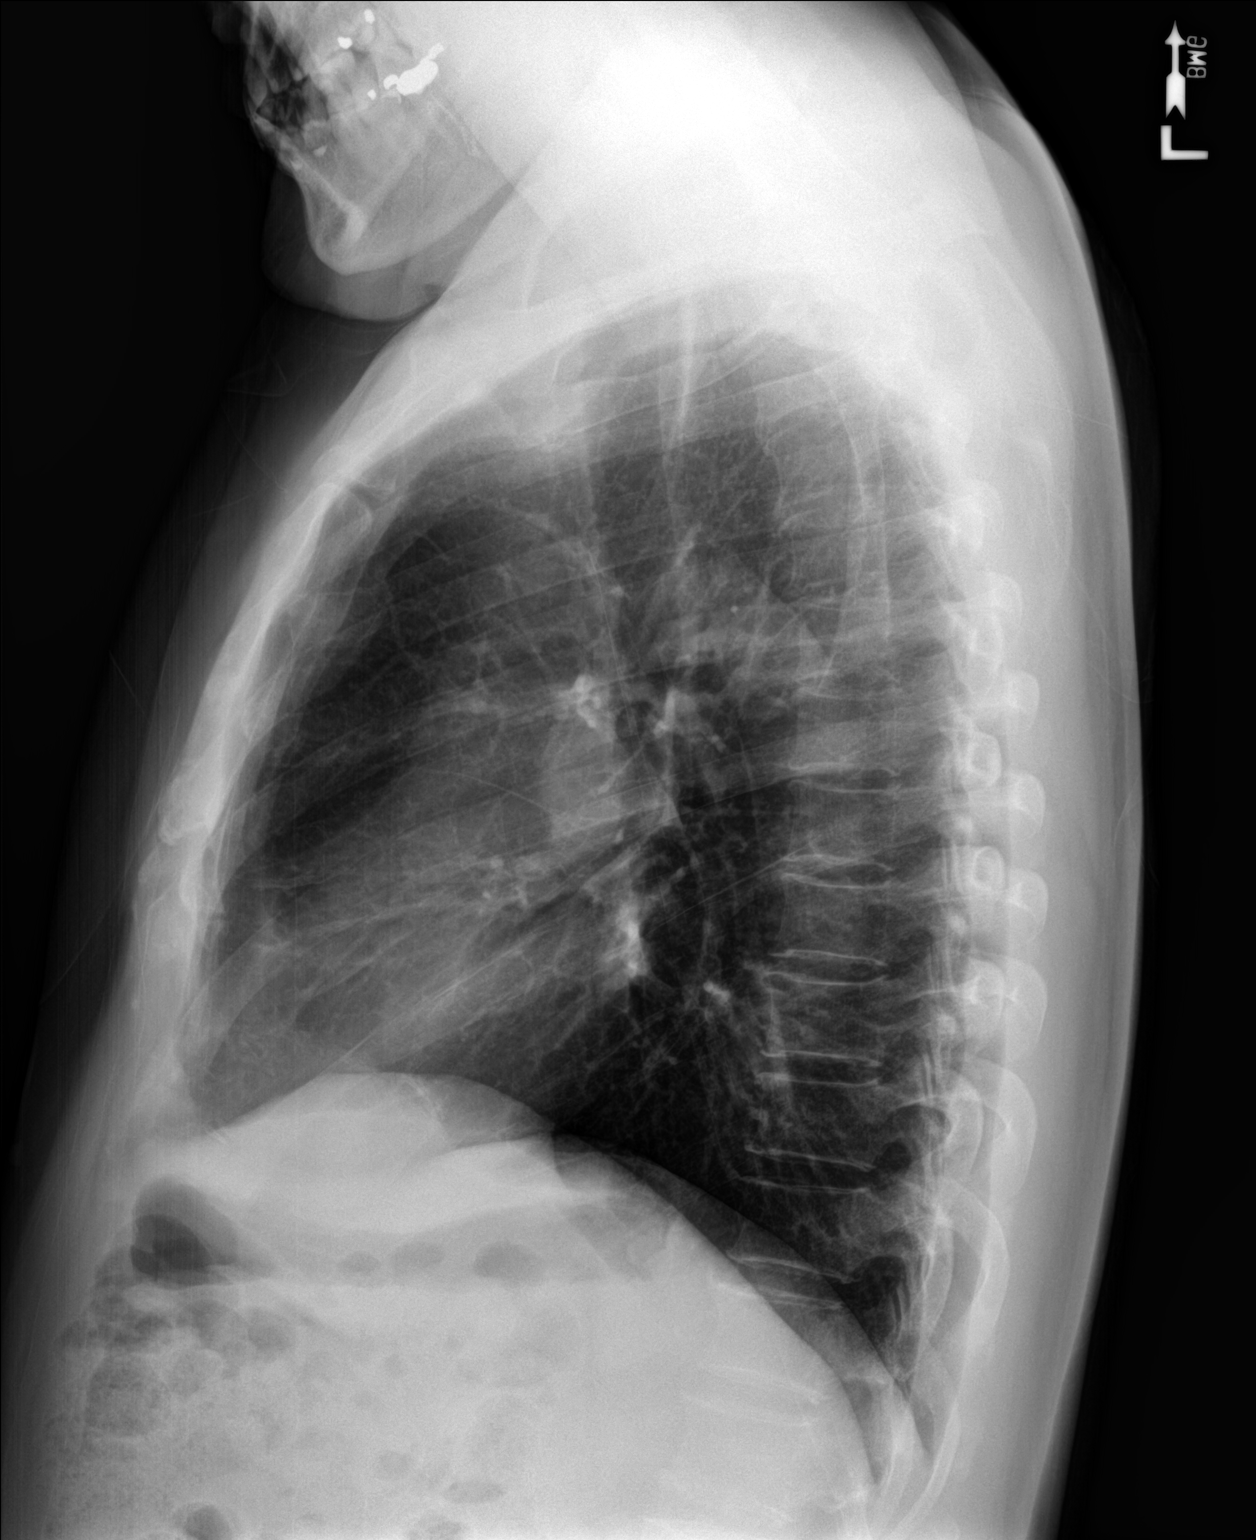

[2 of 2 positions shown; findings below may reference images not displayed]

FINDINGS: The heart size and mediastinal contours are within normal limits.
Both lungs are clear. The visualized skeletal structures are
unremarkable.
IMPRESSION: No active cardiopulmonary disease.

## 2019-06-12 ENCOUNTER — Other Ambulatory Visit: Payer: Self-pay | Admitting: Family Medicine

## 2019-06-12 DIAGNOSIS — J4520 Mild intermittent asthma, uncomplicated: Secondary | ICD-10-CM

## 2019-06-12 NOTE — Telephone Encounter (Signed)
Requested medication (s) are due for refill today:yes  Requested medication (s) are on the active medication list: yes  Last refill:  03/05/18  Future visit scheduled: No  Notes to clinic:  Needs office visit.    Requested Prescriptions  Pending Prescriptions Disp Refills   VENTOLIN HFA 108 (90 Base) MCG/ACT inhaler [Pharmacy Med Name: Ventolin HFA 108 (90 Base) MCG/ACT Inhalation Aerosol Solution] 18 g 0    Sig: INHALE 1 TO 2 PUFFS BY MOUTH EVERY 4 HOURS AS NEEDED FOR WHEEZING AND FOR SHORTNESS OF BREATH     Pulmonology:  Beta Agonists Failed - 06/12/2019  4:22 PM      Failed - One inhaler should last at least one month. If the patient is requesting refills earlier, contact the patient to check for uncontrolled symptoms.      Failed - Valid encounter within last 12 months    Recent Outpatient Visits          1 year ago Seasonal allergies   [facility], Lilia Argue, MD   1 year ago Cough   [facility] at [facility], Ines Bloomer, MD   1 year ago Cough   [facility], Ranell Patrick, MD   1 year ago Annual physical exam   [facility], Ranell Patrick, MD

## 2020-09-29 ENCOUNTER — Encounter (HOSPITAL_BASED_OUTPATIENT_CLINIC_OR_DEPARTMENT_OTHER): Payer: Self-pay | Admitting: Emergency Medicine

## 2020-09-29 ENCOUNTER — Other Ambulatory Visit: Payer: Self-pay

## 2020-09-29 ENCOUNTER — Emergency Department (HOSPITAL_BASED_OUTPATIENT_CLINIC_OR_DEPARTMENT_OTHER)
Admission: EM | Admit: 2020-09-29 | Discharge: 2020-09-29 | Disposition: A | Discharge status: Home / Self Care | Payer: 59 | Attending: Emergency Medicine | Admitting: Emergency Medicine

## 2020-09-29 ENCOUNTER — Other Ambulatory Visit (HOSPITAL_BASED_OUTPATIENT_CLINIC_OR_DEPARTMENT_OTHER): Payer: Self-pay | Admitting: Emergency Medicine

## 2020-09-29 DIAGNOSIS — Z87891 Personal history of nicotine dependence: Secondary | ICD-10-CM | POA: Diagnosis not present

## 2020-09-29 DIAGNOSIS — J449 Chronic obstructive pulmonary disease, unspecified: Secondary | ICD-10-CM | POA: Diagnosis not present

## 2020-09-29 DIAGNOSIS — J4541 Moderate persistent asthma with (acute) exacerbation: Secondary | ICD-10-CM | POA: Diagnosis not present

## 2020-09-29 DIAGNOSIS — R0602 Shortness of breath: Secondary | ICD-10-CM | POA: Diagnosis present

## 2020-09-29 MED ORDER — PREDNISONE 10 MG (21) PO TBPK: ORAL_TABLET | ORAL | 0 refills | Status: DC

## 2020-09-29 MED ORDER — AZITHROMYCIN 250 MG PO TABS: 250.0000 mg | ORAL_TABLET | Freq: Every day | ORAL | 0 refills | Status: DC

## 2020-09-29 MED ORDER — ALBUTEROL SULFATE HFA 108 (90 BASE) MCG/ACT IN AERS
8.0000 | INHALATION_SPRAY | Freq: Once | RESPIRATORY_TRACT | Status: AC
  Administered 2020-09-29: 8 via RESPIRATORY_TRACT
  Filled 2020-09-29: qty 6.7

## 2020-09-29 MED FILL — AZITHROMYCIN 250 MG TABLET: 250 | 5 days supply | Qty: 6 | Fill #0

## 2020-09-29 MED FILL — predniSONE 10 MG TABS: 10 | 12 days supply | Qty: 42 | Fill #0

## 2020-09-29 NOTE — ED Triage Notes (Signed)
Pt having exacerbation of asthma.  Pt treated the end of December for same.  Medications helped but now his symptoms have returned.

## 2020-09-29 NOTE — ED Provider Notes (Signed)
Emergency Department Provider Note   I have reviewed the triage vital signs and the nursing notes.   HISTORY  Chief Complaint Shortness of Breath   HPI Timothy Briggs is a 60 y.o. male presents to the ED with SOB and wheezing consistent with asthma exacerbation. Noted to have similar symptoms in December with improvement after steroids. Not currently on steroid or abx. No CP. No fever, chills, or URI symptoms. No sick contacts.   Past Medical History:  Diagnosis Date  . Allergy   . Asthma   . COPD (chronic obstructive pulmonary disease) ([place])    beginning of COPD along with Asthma  . Hyperlipidemia    not on medicine diet controlled    Patient Active Problem List   Diagnosis Date Noted  . Seasonal allergies 01/03/2018  . Cough 11/01/2017  . Lower respiratory infection 11/01/2017  . History of asthma 11/01/2017    Past Surgical History:  Procedure Laterality Date  . COLONOSCOPY     in [place] 10+ yrs ago    Allergies Patient has no known allergies.  Family History  Problem Relation Age of Onset  . Colon cancer Neg Hx   . Colon polyps Neg Hx   . Esophageal cancer Neg Hx   . Rectal cancer Neg Hx   . Stomach cancer Neg Hx     Social History Social History   Tobacco Use  . Smoking status: Former Smoker    Types: Cigarettes    Start date: 07/05/1980    Quit date: 02/03/2016    Years since quitting: 4.6  . Smokeless tobacco: Never Used  Substance Use Topics  . Alcohol use: No  . Drug use: No    Review of Systems  Constitutional: No fever/chills Eyes: No visual changes. ENT: No sore throat. Cardiovascular: Denies chest pain. Respiratory: Positive shortness of breath and wheezing.  Gastrointestinal: No abdominal pain.  No nausea, no vomiting.  No diarrhea.  No constipation. Genitourinary: Negative for dysuria. Musculoskeletal: Negative for back pain. Skin: Negative for rash. Neurological: Negative for headaches, focal weakness or  numbness.  10-point ROS otherwise negative.  ____________________________________________   PHYSICAL EXAM:  VITAL SIGNS: ED Triage Vitals  Enc Vitals Group     BP 09/29/20 0913 129/75     Pulse Rate 09/29/20 0913 77     Resp 09/29/20 0913 16     Temp 09/29/20 0913 98 F (36.7 C)     Temp Source 09/29/20 0913 Oral     SpO2 09/29/20 0913 96 %     Weight 09/29/20 0914 140 lb (63.5 kg)     Height 09/29/20 0914 5\' 1"  (1.549 m)    Constitutional: Alert and oriented. Well appearing and in no acute distress. Eyes: Conjunctivae are normal. Head: Atraumatic. Nose: No congestion/rhinnorhea. Mouth/Throat: Mucous membranes are moist.  Neck: No stridor. Cardiovascular: Normal rate, regular rhythm. Good peripheral circulation. Grossly normal heart sounds.   Respiratory: Normal respiratory effort.  No retractions. Lungs with mild end-expiratory wheezing.  Gastrointestinal: Soft and nontender. No distention.  Musculoskeletal: No gross deformities of extremities. Neurologic:  Normal speech and language.  Skin:  Skin is warm, dry and intact. No rash noted.  ____________________________________________   PROCEDURES  Procedure(s) performed:   Procedures  None ____________________________________________   INITIAL IMPRESSION / ASSESSMENT AND PLAN / ED COURSE  Pertinent labs & imaging results that were available during my care of the patient were reviewed by me and considered in my medical decision making (see chart for details).  Patient presents to the ED with SOB symptoms consistent with asthma. Doubt CAP or COVID PNA. Patient received albuterol inh and steroid here with improvement. No hypoxemia. Plan for steroid with taper given recent steroid.   At this time, I do not feel there is any life-threatening condition present. I have reviewed and discussed all results (EKG, imaging, lab, urine as appropriate), exam findings with patient. I have reviewed nursing notes and appropriate  previous records.  I feel the patient is safe to be discharged home without further emergent workup. Discussed usual and customary return precautions. Patient and family (if present) verbalize understanding and are comfortable with this plan.  Patient will follow-up with their primary care provider. If they do not have a primary care provider, information for follow-up has been provided to them. All questions have been answered.  ____________________________________________  FINAL CLINICAL IMPRESSION(S) / ED DIAGNOSES  Final diagnoses:  Moderate persistent asthma with exacerbation     MEDICATIONS GIVEN DURING THIS VISIT:  Medications  albuterol (VENTOLIN HFA) 108 (90 Base) MCG/ACT inhaler 8 puff (8 puffs Inhalation Given 09/29/20 0916)     NEW OUTPATIENT MEDICATIONS STARTED DURING THIS VISIT:  Discharge Medication List as of 09/29/2020 10:23 AM    START taking these medications   Details  azithromycin (ZITHROMAX) 250 MG tablet Take 1 tablet (250 mg total) by mouth daily. Take first 2 tablets together, then 1 every day until finished., Starting Mon 09/29/2020, Normal    predniSONE (STERAPRED UNI-PAK 21 TAB) 10 MG (21) TBPK tablet Take 6 tabs by mouth daily  for 2 days, then 5 tabs for 2 days, then 4 tabs for 2 days, then 3 tabs for 2 days, 2 tabs for 2 days, then 1 tab by mouth daily for 2 days, Normal        Note:  This document was prepared using Dragon voice recognition software and may include unintentional dictation errors.  Nanda Quinton, MD, [facility] Emergency Medicine    Jullie Arps, [facility], MD 10/02/20 1000

## 2020-09-29 NOTE — Discharge Instructions (Signed)
You were seen in the emergency room today with asthma exacerbation.  I am starting you on a steroid burst with taper along with antibiotics.  Please use the albuterol inhaler along with the spacer provided here in the emergency department.  Please call your primary care doctor and return to the emergency department any new or suddenly worsening symptoms.

## 2020-11-17 ENCOUNTER — Encounter: Payer: Self-pay | Admitting: Medical

## 2020-11-17 ENCOUNTER — Other Ambulatory Visit: Payer: Self-pay

## 2020-11-17 ENCOUNTER — Ambulatory Visit (INDEPENDENT_AMBULATORY_CARE_PROVIDER_SITE_OTHER): Payer: 59 | Admitting: Medical

## 2020-11-17 ENCOUNTER — Ambulatory Visit (HOSPITAL_BASED_OUTPATIENT_CLINIC_OR_DEPARTMENT_OTHER)
Admission: RE | Admit: 2020-11-17 | Discharge: 2020-11-17 | Disposition: A | Discharge status: Home / Self Care | Payer: 59 | Source: Ambulatory Visit | Attending: Medical | Admitting: Medical

## 2020-11-17 VITALS — BP 108/85 | HR 79 | Resp 18 | Ht 61.0 in | Wt 143.0 lb

## 2020-11-17 DIAGNOSIS — Z87891 Personal history of nicotine dependence: Secondary | ICD-10-CM

## 2020-11-17 DIAGNOSIS — E559 Vitamin D deficiency, unspecified: Secondary | ICD-10-CM

## 2020-11-17 DIAGNOSIS — R059 Cough, unspecified: Secondary | ICD-10-CM

## 2020-11-17 DIAGNOSIS — E785 Hyperlipidemia, unspecified: Secondary | ICD-10-CM

## 2020-11-17 DIAGNOSIS — Z8709 Personal history of other diseases of the respiratory system: Secondary | ICD-10-CM | POA: Insufficient documentation

## 2020-11-17 DIAGNOSIS — J302 Other seasonal allergic rhinitis: Secondary | ICD-10-CM | POA: Diagnosis not present

## 2020-11-17 DIAGNOSIS — R252 Cramp and spasm: Secondary | ICD-10-CM

## 2020-11-17 MED ORDER — LEVOCETIRIZINE DIHYDROCHLORIDE 5 MG PO TABS: 5.0000 mg | ORAL_TABLET | Freq: Every evening | ORAL | 3 refills | Status: DC

## 2020-11-17 MED ORDER — BUDESONIDE-FORMOTEROL FUMARATE 160-4.5 MCG/ACT IN AERO: 2.0000 | INHALATION_SPRAY | Freq: Two times a day (BID) | RESPIRATORY_TRACT | 3 refills | Status: DC

## 2020-11-17 NOTE — Progress Notes (Signed)
Subjective:    Patient ID: Timothy Briggs, male    DOB: 01/03/1961, 60 y.o.   MRN: [id number]  HPI  Pt in for first time.  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 Works 12 hours. No alcohol use. Hx of smoking.    Pt has high cholesterol. On atorvastatin.   Pt also has hx of low vit D.  Pt has history of asthma. Last time he had to use was about 2 weeks ago. He states at times he uses albuterol 1-2 times a week. Hx of chronic cough on and off for 2 years. Cough is all year round. Pt at one point thought allergies but same all year round.    Review of Systems  Constitutional: Negative for chills, fatigue and fever.  HENT: Negative for congestion and ear pain.   Respiratory: Positive for cough. Negative for chest tightness, shortness of breath and wheezing.   Cardiovascular: Negative for chest pain and palpitations.  Gastrointestinal: Negative for abdominal pain, constipation and rectal pain.  Musculoskeletal: Negative for back pain.  Skin: Negative for rash.  Neurological: Negative for dizziness, weakness, numbness and headaches.  Hematological: Negative for adenopathy. Does not bruise/bleed easily.  Psychiatric/Behavioral: Negative for behavioral problems, confusion and sleep disturbance. The patient is not nervous/anxious.     Past Medical History:  Diagnosis Date  . Allergy   . Asthma   . COPD (chronic obstructive pulmonary disease) ([place])    beginning of COPD along with Asthma  . Hyperlipidemia    not on medicine diet controlled     Social History   Socioeconomic History  . Marital status: Divorced    Spouse name: Not on file  . Number of children: Not on file  . Years of education: Not on file  . Highest education level: Not on file  Occupational History  . Occupation: Architectural technologist.  Tobacco Use  . Smoking status: Former Smoker    Packs/day: 0.50    Years: 32.00    Pack years: 16.00    Types: Cigarettes    Start date:  07/05/1980    Quit date: 02/03/2011    Years since quitting: 9.7  . Smokeless tobacco: Never Used  Vaping Use  . Vaping Use: Never used  Substance and Sexual Activity  . Alcohol use: No  . Drug use: No  . Sexual activity: Not on file  Other Topics Concern  . Not on file  Social History Narrative  . Not on file   Social Determinants of Health   Financial Resource Strain: Not on file  Food Insecurity: Not on file  Transportation Needs: Not on file  Physical Activity: Not on file  Stress: Not on file  Social Connections: Not on file  Intimate Partner Violence: Not on file    Past Surgical History:  Procedure Laterality Date  . COLONOSCOPY     in [place] 10+ yrs ago    Family History  Problem Relation Age of Onset  . Colon cancer Neg Hx   . Colon polyps Neg Hx   . Esophageal cancer Neg Hx   . Rectal cancer Neg Hx   . Stomach cancer Neg Hx     No Known Allergies  Current Outpatient Medications on File Prior to Visit  Medication Sig Dispense Refill  . atorvastatin (LIPITOR) 10 MG tablet Take 1 tablet by mouth daily.    . ergocalciferol (VITAMIN D2) 1.25 MG (50000 UT) capsule Take 1 capsule by mouth  once a week.    Marland Kitchen albuterol (PROVENTIL HFA;VENTOLIN HFA) 108 (90 Base) MCG/ACT inhaler Inhale 1-2 puffs into the lungs every 4 (four) hours as needed for wheezing or shortness of breath. (Patient not taking: Reported on 11/17/2020) 1 Inhaler 3  . azithromycin (ZITHROMAX) 250 MG tablet Take 1 tablet (250 mg total) by mouth daily. Take first 2 tablets together, then 1 every day until finished. (Patient not taking: Reported on 11/17/2020) 6 tablet 0  . predniSONE (STERAPRED UNI-PAK 21 TAB) 10 MG (21) TBPK tablet Take 6 tabs by mouth daily  for 2 days, then 5 tabs for 2 days, then 4 tabs for 2 days, then 3 tabs for 2 days, 2 tabs for 2 days, then 1 tab by mouth daily for 2 days (Patient not taking: Reported on 11/17/2020) 42 tablet 0   No current facility-administered medications  on file prior to visit.    BP 108/85   Pulse 79   Resp 18   Ht 5\' 1"  (1.549 m)   Wt 143 lb (64.9 kg)   SpO2 92%   BMI 27.02 kg/m       Objective:   Physical Exam  General Mental Status- Alert. General Appearance- Not in acute distress.   Skin General: Color- Normal Color. Moisture- Normal Moisture.  Neck Carotid Arteries- Normal color. Moisture- Normal Moisture. No carotid bruits. No JVD.  Chest and Lung Exam Auscultation: Breath Sounds:-Normal.  Cardiovascular Auscultation:Rythm- Regular. Murmurs & Other Heart Sounds:Auscultation of the heart reveals- No Murmurs.  Abdomen Inspection:-Inspeection Normal. Palpation/Percussion:Note:No mass. Palpation and Percussion of the abdomen reveal- Non Tender, Non Distended + BS, no rebound or guarding.    Neurologic Cranial Nerve exam:- CN III-XII intact(No nystagmus), symmetric smile. Strength:- 5/5 equal and symmetric strength both upper and lower extremities.      Assessment & Plan:  Seasonal allergies and spring is basically already here.  Prescribe Xyzal antihistamine to take 1 tablet at night.  Also could use Flonase nasal spray as well as needed.  History of asthma and history of smoking.  Recommend not restarting smoking.  Sent in Symbicort inhaler to your pharmacy.  Use daily until all follow-up.  While using Symbicort you would only use albuterol if needed.  Chest x-ray today.  History of recent cramping of hands and the lower extremity at times.  Will get metabolic panel and magnesium level.  History of vitamin D deficiency so placed vitamin D order as well.  History of hyperlipidemia.  We will follow metabolic panel and lipid panel.  Continue atorvastatin 10 mg daily.  Follow-up in 2 weeks or as needed.  Mackie Pai, PA-C

## 2020-11-17 NOTE — Patient Instructions (Addendum)
Seasonal allergies and spring is basically already here.  Prescribe Xyzal antihistamine to take 1 tablet at night.  Also could use Flonase nasal spray as well as needed.  History of asthma and history of smoking.  Recommend not restarting smoking.  Sent in Symbicort inhaler to your pharmacy.  Use daily until all follow-up.  While using Symbicort you would only use albuterol if needed.  Chest x-ray today.  History of recent cramping of hands and the lower extremity at times.  Will get metabolic panel and magnesium level.  History of vitamin D deficiency so placed vitamin D order as well.  History of hyperlipidemia.  We will follow metabolic panel and lipid panel.  Continue atorvastatin 10 mg daily.  Follow-up in 2 weeks or as needed.

## 2020-11-18 LAB — COMPREHENSIVE METABOLIC PANEL
ALT: 16 U/L (ref 0–53)
AST: 20 U/L (ref 0–37)
Albumin: 4.1 g/dL (ref 3.5–5.2)
Alkaline Phosphatase: 46 U/L (ref 39–117)
BUN: 16 mg/dL (ref 6–23)
CO2: 30 mEq/L (ref 19–32)
Calcium: 9.2 mg/dL (ref 8.4–10.5)
Chloride: 102 mEq/L (ref 96–112)
Creatinine, Ser: 1.05 mg/dL (ref 0.40–1.50)
GFR: 77.51 mL/min (ref >60.00)
Glucose, Bld: 88 mg/dL (ref 70–99)
Potassium: 4 mEq/L (ref 3.5–5.1)
Sodium: 138 mEq/L (ref 135–145)
Total Bilirubin: 0.6 mg/dL (ref 0.2–1.2)
Total Protein: 7 g/dL (ref 6.0–8.3)

## 2020-11-18 LAB — LIPID PANEL
Cholesterol: 230 mg/dL — ABNORMAL HIGH (ref 0–200)
HDL: 47.3 mg/dL (ref >39.00)
LDL Cholesterol: 158 mg/dL — ABNORMAL HIGH (ref 0–99)
NonHDL: 182.3
Total CHOL/HDL Ratio: 5
Triglycerides: 123 mg/dL (ref 0.0–149.0)
VLDL: 24.6 mg/dL (ref 0.0–40.0)

## 2020-11-18 LAB — MAGNESIUM: Magnesium: 2.2 mg/dL (ref 1.5–2.5)

## 2020-11-20 LAB — VITAMIN D 1,25 DIHYDROXY
Vitamin D 1, 25 (OH)2 Total: 39 pg/mL (ref 18–72)
Vitamin D2 1, 25 (OH)2: <8 pg/mL
Vitamin D3 1, 25 (OH)2: 39 pg/mL

## 2020-12-01 ENCOUNTER — Other Ambulatory Visit: Payer: Self-pay

## 2020-12-01 ENCOUNTER — Ambulatory Visit (INDEPENDENT_AMBULATORY_CARE_PROVIDER_SITE_OTHER): Payer: 59 | Admitting: Medical

## 2020-12-01 VITALS — BP 133/91 | HR 87 | Resp 18 | Ht 61.0 in | Wt 146.0 lb

## 2020-12-01 DIAGNOSIS — R059 Cough, unspecified: Secondary | ICD-10-CM | POA: Diagnosis not present

## 2020-12-01 DIAGNOSIS — E785 Hyperlipidemia, unspecified: Secondary | ICD-10-CM

## 2020-12-01 DIAGNOSIS — R252 Cramp and spasm: Secondary | ICD-10-CM

## 2020-12-01 DIAGNOSIS — Z8709 Personal history of other diseases of the respiratory system: Secondary | ICD-10-CM | POA: Diagnosis not present

## 2020-12-01 DIAGNOSIS — J302 Other seasonal allergic rhinitis: Secondary | ICD-10-CM

## 2020-12-01 MED ORDER — FAMOTIDINE 20 MG PO TABS: 20.0000 mg | ORAL_TABLET | Freq: Every day | ORAL | 0 refills | Status: DC

## 2020-12-01 MED ORDER — BENZONATATE 100 MG PO CAPS: 100.0000 mg | ORAL_CAPSULE | Freq: Three times a day (TID) | ORAL | 0 refills | Status: DC | PRN

## 2020-12-01 NOTE — Patient Instructions (Addendum)
Your recent cough seems to have gotten much better with treatment for allergic rhinitis and asthma.  Continue Xyzal, Flonase and Symbicort.  However you do report very faint residual itchy sensation inside the chest wall/suprasternal notch region.  Makes me consider that you might be having some intermittent reflux symptoms.  Went ahead and added famotidine to your current meds.  Recommend eating healthy diet.  Also making benzonatate tablets available to use if you need for random cough.  Particularly he could try using while at work.  History of intermittent cramps_last time and persist.  Metabolic panel was normal and magnesium level was normal.  For periodic muscle cramps could try Hyland's leg cramp over-the-counter product.  Hyperlipidemia.  Continue atorvastatin and  Eat low-cholesterol diet.  Recommend follow-up in about 2 to 3 months.  At that time can repeat fasting labs and go ahead to wellness exam.

## 2020-12-01 NOTE — Progress Notes (Signed)
Subjective:    Patient ID: Timothy Briggs, male    DOB: [birth year], 60 y.o.   MRN: [id number]  HPI  Pt in for follow up.  Pt tells me he is having some itching sensation in upper chest area below suprasternal notch and will get random need to cough. No obvious reflux type symptoms. Just starts randomly about 2-3 times a day. One random brief cough. Occurs randomly at work.  Random cough does not occur at night.   Pt did restart the symbicort. Pt also dis start xyzal and flonase for allergies.   Overall he feels better but still very random cough. But a lot less frequent.    Pt mentioned today. That sometimes he get randoms cramps in hand, flank and legs.     Review of Systems  Constitutional: Negative for chills, fatigue and fever.  HENT: Negative for congestion, postnasal drip, sinus pressure, sinus pain and sore throat.        Allergy symptoms improved.  Respiratory: Negative for cough, chest tightness, shortness of breath and wheezing.   Cardiovascular: Negative for chest pain and palpitations.  Gastrointestinal: Negative for abdominal pain.  Musculoskeletal: Negative for back pain, joint swelling, myalgias and neck stiffness.  Skin: Negative for rash.  Neurological: Negative for dizziness, speech difficulty, weakness, numbness and headaches.  Hematological: Negative for adenopathy. Does not bruise/bleed easily.  Psychiatric/Behavioral: Negative for behavioral problems, confusion, dysphoric mood, sleep disturbance and suicidal ideas. The patient is not nervous/anxious.     Past Medical History:  Diagnosis Date  . Allergy   . Asthma   . COPD (chronic obstructive pulmonary disease) ([place])    beginning of COPD along with Asthma  . Hyperlipidemia    not on medicine diet controlled     Social History   Socioeconomic History  . Marital status: Divorced    Spouse name: Not on file  . Number of children: Not on file  . Years of education: Not on file  . Highest education  level: Not on file  Occupational History  . Occupation: Architectural technologist.  Tobacco Use  . Smoking status: Former Smoker    Packs/day: 0.50    Years: 32.00    Pack years: 16.00    Types: Cigarettes    Start date: 07/05/1980    Quit date: 02/03/2011    Years since quitting: 9.8  . Smokeless tobacco: Never Used  Vaping Use  . Vaping Use: Never used  Substance and Sexual Activity  . Alcohol use: No  . Drug use: No  . Sexual activity: Not on file  Other Topics Concern  . Not on file  Social History Narrative  . Not on file   Social Determinants of Health   Financial Resource Strain: Not on file  Food Insecurity: Not on file  Transportation Needs: Not on file  Physical Activity: Not on file  Stress: Not on file  Social Connections: Not on file  Intimate Partner Violence: Not on file    Past Surgical History:  Procedure Laterality Date  . COLONOSCOPY     in [place] 10+ yrs ago    Family History  Problem Relation Age of Onset  . Colon cancer Neg Hx   . Colon polyps Neg Hx   . Esophageal cancer Neg Hx   . Rectal cancer Neg Hx   . Stomach cancer Neg Hx     No Known Allergies  Current Outpatient Medications on File Prior to Visit  Medication Sig Dispense Refill  .  ergocalciferol (VITAMIN D2) 1.25 MG (50000 UT) capsule Take 1 capsule by mouth once a week.    . levocetirizine (XYZAL) 5 MG tablet Take 1 tablet (5 mg total) by mouth every evening. 30 tablet 3  . albuterol (PROVENTIL HFA;VENTOLIN HFA) 108 (90 Base) MCG/ACT inhaler Inhale 1-2 puffs into the lungs every 4 (four) hours as needed for wheezing or shortness of breath. (Patient not taking: No sig reported) 1 Inhaler 3  . atorvastatin (LIPITOR) 10 MG tablet Take 1 tablet by mouth daily. (Patient not taking: Reported on 12/01/2020)    . azithromycin (ZITHROMAX) 250 MG tablet Take 1 tablet (250 mg total) by mouth daily. Take first 2 tablets together, then 1 every day until finished. (Patient not taking: No sig reported)  6 tablet 0  . budesonide-formoterol (SYMBICORT) 160-4.5 MCG/ACT inhaler Inhale 2 puffs into the lungs 2 (two) times daily. (Patient not taking: Reported on 12/01/2020) 1 each 3  . predniSONE (STERAPRED UNI-PAK 21 TAB) 10 MG (21) TBPK tablet Take 6 tabs by mouth daily  for 2 days, then 5 tabs for 2 days, then 4 tabs for 2 days, then 3 tabs for 2 days, 2 tabs for 2 days, then 1 tab by mouth daily for 2 days (Patient not taking: No sig reported) 42 tablet 0   No current facility-administered medications on file prior to visit.    BP (!) 133/91   Pulse 87   Resp 18   Ht 5\' 1"  (1.549 m)   Wt 146 lb (66.2 kg)   SpO2 98%   BMI 27.59 kg/m       Objective:   Physical Exam   General Mental Status- Alert. General Appearance- Not in acute distress.   Skin General: Color- Normal Color. Moisture- Normal Moisture.  Neck Carotid Arteries- Normal color. Moisture- Normal Moisture. No carotid bruits. No JVD.  Chest and Lung Exam Auscultation: Breath Sounds:-Normal.  Cardiovascular Auscultation:Rythm- Regular. Murmurs & Other Heart Sounds:Auscultation of the heart reveals- No Murmurs.  Abdomen Inspection:-Inspeection Normal. Palpation/Percussion:Note:No mass. Palpation and Percussion of the abdomen reveal- Non Tender, Non Distended + BS, no rebound or guarding.    Neurologic Cranial Nerve exam:- CN III-XII intact(No nystagmus), symmetric smile. Strength:- 5/5 equal and symmetric strength both upper and lower extremities.     Assessment & Plan:  Your recent cough seems to have gotten much better with treatment for allergic rhinitis and asthma.  Continue Xyzal, Flonase and Symbicort.  However you do report very faint residual itchy sensation inside the chest wall/suprasternal notch region.  Makes me consider that you might be having some intermittent reflux symptoms.  Went ahead and added famotidine to your current meds.  Recommend eating healthy diet.  Also making benzonatate tablets  available to use if you need for random cough.  Particularly he could try using while at work.  History of intermittent cramps_last time and persist.  Metabolic panel was normal and magnesium level was normal.  For periodic muscle cramps could try Hyland's leg cramp over-the-counter product.  Hyperlipidemia.  Continue atorvastatin and eat low-cholesterol diet.  Recommend follow-up in about 2 to 3 months.  At that time can repeat fasting labs and go ahead to wellness exam.

## 2021-03-09 ENCOUNTER — Other Ambulatory Visit: Payer: Self-pay

## 2021-03-09 ENCOUNTER — Ambulatory Visit (INDEPENDENT_AMBULATORY_CARE_PROVIDER_SITE_OTHER): Payer: 59 | Admitting: Medical

## 2021-03-09 ENCOUNTER — Encounter: Payer: Self-pay | Admitting: Medical

## 2021-03-09 VITALS — BP 120/70 | HR 63 | Temp 98.5°F | Resp 18 | Ht 61.0 in | Wt 150.4 lb

## 2021-03-09 DIAGNOSIS — R059 Cough, unspecified: Secondary | ICD-10-CM | POA: Diagnosis not present

## 2021-03-09 DIAGNOSIS — Z8709 Personal history of other diseases of the respiratory system: Secondary | ICD-10-CM

## 2021-03-09 DIAGNOSIS — E785 Hyperlipidemia, unspecified: Secondary | ICD-10-CM | POA: Diagnosis not present

## 2021-03-09 DIAGNOSIS — J302 Other seasonal allergic rhinitis: Secondary | ICD-10-CM

## 2021-03-09 LAB — LIPID PANEL
Cholesterol: 207 mg/dL — ABNORMAL HIGH (ref 0–200)
HDL: 44.4 mg/dL (ref >39.00)
LDL Cholesterol: 131 mg/dL — ABNORMAL HIGH (ref 0–99)
NonHDL: 162.46
Total CHOL/HDL Ratio: 5
Triglycerides: 155 mg/dL — ABNORMAL HIGH (ref 0.0–149.0)
VLDL: 31 mg/dL (ref 0.0–40.0)

## 2021-03-09 LAB — COMPREHENSIVE METABOLIC PANEL
ALT: 25 U/L (ref 0–53)
AST: 26 U/L (ref 0–37)
Albumin: 4.4 g/dL (ref 3.5–5.2)
Alkaline Phosphatase: 52 U/L (ref 39–117)
BUN: 15 mg/dL (ref 6–23)
CO2: 29 mEq/L (ref 19–32)
Calcium: 9.2 mg/dL (ref 8.4–10.5)
Chloride: 102 mEq/L (ref 96–112)
Creatinine, Ser: 1.02 mg/dL (ref 0.40–1.50)
GFR: 80.08 mL/min (ref >60.00)
Glucose, Bld: 100 mg/dL — ABNORMAL HIGH (ref 70–99)
Potassium: 4.2 mEq/L (ref 3.5–5.1)
Sodium: 139 mEq/L (ref 135–145)
Total Bilirubin: 0.5 mg/dL (ref 0.2–1.2)
Total Protein: 7.3 g/dL (ref 6.0–8.3)

## 2021-03-09 MED ORDER — FAMOTIDINE 20 MG PO TABS: 20.0000 mg | ORAL_TABLET | Freq: Every day | ORAL | 0 refills | Status: AC

## 2021-03-09 MED ORDER — ATORVASTATIN CALCIUM 40 MG PO TABS: ORAL_TABLET | ORAL | 0 refills | Status: DC

## 2021-03-09 MED ORDER — FLUTICASONE PROPIONATE 50 MCG/ACT NA SUSP: 2.0000 | Freq: Every day | NASAL | 1 refills | Status: DC

## 2021-03-09 MED ORDER — LEVOCETIRIZINE DIHYDROCHLORIDE 5 MG PO TABS: 5.0000 mg | ORAL_TABLET | Freq: Every evening | ORAL | 3 refills | Status: DC

## 2021-03-09 MED ORDER — BUDESONIDE-FORMOTEROL FUMARATE 160-4.5 MCG/ACT IN AERO: 2.0000 | INHALATION_SPRAY | Freq: Two times a day (BID) | RESPIRATORY_TRACT | 12 refills | Status: DC

## 2021-03-09 MED ORDER — PREDNISONE 10 MG (21) PO TBPK: ORAL_TABLET | ORAL | 0 refills | Status: DC

## 2021-03-09 MED ORDER — ATORVASTATIN CALCIUM 20 MG PO TABS: 20.0000 mg | ORAL_TABLET | Freq: Every day | ORAL | 3 refills | Status: DC

## 2021-03-09 NOTE — Patient Instructions (Addendum)
Hx of chronic intermittent cough for years. Cough persist despite [place] of symbicort, xyzal, flonase and famotadine. Advise continue these. Will add 6 day taper prednisone and well see if this resolved or improves your cough. Also will go ahead and refer you to pulmonologist to evaluate your cough further.  Will get cmp and lipid panel today for history of high cholesterol.  Follow up in 7 days or as needed

## 2021-03-09 NOTE — Addendum Note (Signed)
Addended by: Anabel Halon on: 03/09/2021 06:46 PM   Modules accepted: Orders

## 2021-03-09 NOTE — Progress Notes (Signed)
Subjective:    Patient ID: Timothy Briggs, male    DOB: 10/03/1960, 60 y.o.   MRN: [id number]  HPI  Pt in for follow up.  Pt tell me he still has cough. Pt states most of time cough will be at work. He states feel sensation in upper chest that makes him want to cough. Pt is a Architectural technologist.    Pt chest xray below done 11/17/2020.  ED visit for moderate persistent asthma in the past.   Pt does not report any wheezing. Last visit I had prescribed symbicort, xyzal and flonase. Despite this he reports cough perisistent.  Last visit also prescribed famotatine to use in event cough from reflux. Did not impact cough.   Overall now pt tell me has intermittent cough on and off for 15 years. Overall he thinks some better.   Review of Systems  Constitutional:  Negative for chills, fatigue and fever.  HENT:  Negative for congestion, dental problem and ear pain.   Respiratory:  Negative for cough, chest tightness, shortness of breath and wheezing.   Cardiovascular:  Negative for chest pain and palpitations.  Gastrointestinal:  Negative for abdominal distention, abdominal pain, constipation and diarrhea.  Genitourinary:  Negative for dysuria.  Neurological:  Negative for dizziness, syncope, speech difficulty, weakness and numbness.  Hematological:  Negative for adenopathy. Does not bruise/bleed easily.  Psychiatric/Behavioral:  Negative for behavioral problems and decreased concentration.      Past Medical History:  Diagnosis Date   Allergy    Asthma    COPD (chronic obstructive pulmonary disease) (HCC)    beginning of COPD along with Asthma   Hyperlipidemia    not on medicine diet controlled     Social History   Socioeconomic History   Marital status: Divorced    Spouse name: Not on file   Number of children: Not on file   Years of education: Not on file   Highest education level: Not on file  Occupational History   Occupation: Architectural technologist.  Tobacco Use   Smoking status:  Former    Packs/day: 0.50    Years: 32.00    Pack years: 16.00    Types: Cigarettes    Start date: 07/05/1980    Quit date: 02/03/2011    Years since quitting: 10.1   Smokeless tobacco: Never  Vaping Use   Vaping Use: Never used  Substance and Sexual Activity   Alcohol use: No   Drug use: No   Sexual activity: Not on file  Other Topics Concern   Not on file  Social History Narrative   Not on file   Social Determinants of Health   Financial Resource Strain: Not on file  Food Insecurity: Not on file  Transportation Needs: Not on file  Physical Activity: Not on file  Stress: Not on file  Social Connections: Not on file  Intimate Partner Violence: Not on file    Past Surgical History:  Procedure Laterality Date   COLONOSCOPY     in [place] 10+ yrs ago    Family History  Problem Relation Age of Onset   Colon cancer Neg Hx    Colon polyps Neg Hx    Esophageal cancer Neg Hx    Rectal cancer Neg Hx    Stomach cancer Neg Hx     No Known Allergies  Current Outpatient Medications on File Prior to Visit  Medication Sig Dispense Refill   budesonide-formoterol (SYMBICORT) 160-4.5 MCG/ACT inhaler Inhale 2 puffs into the  lungs 2 (two) times daily. 1 each 3   ergocalciferol (VITAMIN D2) 1.25 MG (50000 UT) capsule Take 1 capsule by mouth once a week.     famotidine (PEPCID) 20 MG tablet Take 1 tablet (20 mg total) by mouth daily. 30 tablet 0   levocetirizine (XYZAL) 5 MG tablet Take 1 tablet (5 mg total) by mouth every evening. 30 tablet 3   No current facility-administered medications on file prior to visit.    BP (!) 149/73   Pulse 63   Temp 98.5 F (36.9 C)   Resp 18   Ht 5\' 1"  (1.549 m)   Wt 150 lb 6.4 oz (68.2 kg)   SpO2 95%   BMI 28.42 kg/m       Objective:   Physical Exam  General Mental Status- Alert. General Appearance- Not in acute distress.(During interview pt did not cough)  Skin General: Color- Normal Color. Moisture- Normal  Moisture.  Neck Carotid Arteries- Normal color. Moisture- Normal Moisture. No carotid bruits. No JVD.  Chest and Lung Exam Auscultation: Breath Sounds:-Normal.  Cardiovascular Auscultation:Rythm- Regular. Murmurs & Other Heart Sounds:Auscultation of the heart reveals- No Murmurs.  Abdomen Inspection:-Inspeection Normal. Palpation/Percussion:Note:No mass. Palpation and Percussion of the abdomen reveal- Non Tender, Non Distended + BS, no rebound or guarding.    Neurologic Cranial Nerve exam:- CN III-XII intact(No nystagmus), symmetric smile. Drift Test:- No drift. Romberg Exam:- Negative.  Heal to Toe Gait exam:-Normal. Finger to Nose:- Normal/Intact Strength:- 5/5 equal and symmetric strength both upper and lower extremities.       Assessment & Plan:  Hx of chronic intermittent cough for years. Cough persist despite [place] of symbicort, xyzal, flonase and famotadine. Advise continue these. Will add 6 day taper prednisone and well see if this resolved or improves your cough. Also will go ahead and refer you to pulmonologist to evaluate your cough further.  Will get cmp and lipid panel today for history of high cholesterol.  Follow up in approximate 4-6 weeks(ideally would like you to follow up about one week after pulmonologist appointment) or as needed.   Mackie Pai, PA-C

## 2021-07-08 ENCOUNTER — Telehealth: Payer: Self-pay | Admitting: Internal Medicine

## 2021-07-08 NOTE — Telephone Encounter (Signed)
Patient scheduled 07/20/21 with Dr. Shearon Stalls for asthma consult.  Per office asthma protocol cbc with diff and Ige is requested within 1 year. Patient has cbc with diff 09/13/20. ATC Patient through [place] interpreter, but no answer.

## 2021-07-20 ENCOUNTER — Ambulatory Visit (INDEPENDENT_AMBULATORY_CARE_PROVIDER_SITE_OTHER): Payer: 59 | Admitting: Internal Medicine

## 2021-07-20 ENCOUNTER — Encounter: Payer: Self-pay | Admitting: Internal Medicine

## 2021-07-20 ENCOUNTER — Other Ambulatory Visit: Payer: Self-pay

## 2021-07-20 VITALS — BP 126/74 | HR 105 | Ht 61.0 in | Wt 152.0 lb

## 2021-07-20 DIAGNOSIS — R1319 Other dysphagia: Secondary | ICD-10-CM

## 2021-07-20 DIAGNOSIS — R0602 Shortness of breath: Secondary | ICD-10-CM | POA: Diagnosis not present

## 2021-07-20 DIAGNOSIS — R053 Chronic cough: Secondary | ICD-10-CM | POA: Diagnosis not present

## 2021-07-20 DIAGNOSIS — Z23 Encounter for immunization: Secondary | ICD-10-CM

## 2021-07-20 MED ORDER — LEVOCETIRIZINE DIHYDROCHLORIDE 5 MG PO TABS: 5.0000 mg | ORAL_TABLET | Freq: Every evening | ORAL | 3 refills | Status: AC

## 2021-07-20 MED ORDER — MONTELUKAST SODIUM 10 MG PO TABS: 10.0000 mg | ORAL_TABLET | Freq: Every day | ORAL | 11 refills | Status: AC

## 2021-07-20 NOTE — Progress Notes (Signed)
Timothy Briggs    [id number]    [birth year]  Primary Care Physician:Saguier, Iris Pert  Referring Physician: Mackie Pai, PA-C [address],  [place] [address] Reason for Consultation: chronic cough Date of Consultation: 07/20/2021  Chief complaint:   Chief Complaint  Patient presents with   Consult    Has had a cough since 2015     HPI: Timothy Briggs is a 60 y.o. man here with chronic cough.  Has had cough daily since 2015. Cough is worse at work. 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 He does not work over a stove, but has in the past. Cough is non productive. He feels itchy in his chest, gets watery eyes and runny nose. Sometimes coughs at night, but it never wakes him up in his sleep.  Denies post nasal drainage, season allergies. He has asthma and takes symbicort prn with wheezing. He has been prescribed prednisone in the last year in January when he had an ED visit.   He has been prescribed medication for reflux and has dysphagia. He is taking pepcid for this which seems to be helping. He is still having episodes of dysphagia. He is throwing up food sometimes when food doesn't go down. Problem is only with solids, not liquids. Denies hematemesis, weight loss.   He has had asthma attacks a few times.   Social history:  Occupation: Architectural technologist, previously worked on [employer]: lives at home with his wife, no pets Smoking history: 32 pack years, quit 2017  Social History   Occupational History   Occupation: Architectural technologist.  Tobacco Use   Smoking status: Former    Packs/day: 0.50    Years: 32.00    Pack years: 16.00    Types: Cigarettes    Start date: 07/05/1980    Quit date: 2017    Years since quitting: 5.8   Smokeless tobacco: Never  Vaping Use   Vaping Use: Never used  Substance and Sexual Activity   Alcohol use: No   Drug use: No   Sexual activity: Not on file    Relevant family history:  Family History  Problem  Relation Age of Onset   Colon cancer Neg Hx    Colon polyps Neg Hx    Esophageal cancer Neg Hx    Rectal cancer Neg Hx    Stomach cancer Neg Hx     Past Medical History:  Diagnosis Date   Allergy    Asthma    COPD (chronic obstructive pulmonary disease) ([facility])    beginning of COPD along with Asthma   Hyperlipidemia    not on medicine diet controlled    Past Surgical History:  Procedure Laterality Date   COLONOSCOPY     in [place] 10+ yrs ago     Physical Exam: Blood pressure 126/74, pulse (!) 105, height 5\' 1"  (1.549 m), weight 152 lb (68.9 kg), SpO2 99 %. Gen:      No acute distress ENT:  no nasal polyps, mucus membranes inflamed, mucus membranes moist, +cobblestoning in oropharynx Lungs:    No increased respiratory effort, symmetric chest wall excursion, clear to auscultation bilaterally, no wheezes or crackles CV:         Regular rate and rhythm; no murmurs, rubs, or gallops.  No pedal edema Abd:      + bowel sounds; soft, non-tender; no distension MSK: no acute synovitis of DIP or PIP joints, no mechanics hands.  Skin:      Warm and dry; no rashes Neuro: normal speech, no focal facial asymmetry Psych: alert and oriented x3, normal mood and affect   Data Reviewed/Medical Decision Making:  Independent interpretation of tests: Imaging:  Review of patient's chest xray images  feb 2022 revealed no acute process. The patient's images have been independently reviewed by me.    PFTs: Spirometry shows ratio 71%, FEV1 109%, FVC 126% from December 2020 at [facility] No flowsheet data found.  Labs:  Lab Results  Component Value Date   WBC 7.5 07/05/2017   HGB 14.4 07/05/2017   HCT 45.1 07/05/2017   MCV 87 07/05/2017   PLT 272 07/05/2017   Lab Results  Component Value Date   NA 139 03/09/2021   K 4.2 03/09/2021   CL 102 03/09/2021   CO2 29 03/09/2021     Immunization status:  Immunization History  Administered Date(s) Administered   Influenza,inj,Quad PF,6+  Mos 07/05/2017   PFIZER(Purple Top)SARS-COV-2 Vaccination 12/31/2019, 01/28/2020   Tdap 07/05/2017     I reviewed prior external note(s) from [facility] pulmonary, PCP, ED  I reviewed the result(s) of the labs and imaging as noted above.   I have ordered PFT   Assessment:  Chronic Cough - suspect UACS with history of allergies and drainage History of tobacco use COPD Gold Stage 0 Dysphagia with solids  Plan/Recommendations: Start montelukast and daily scheduled xyzal Will get a full set of PFTs.  Continue prn symbicort for now.  Will refer to GI for symptoms of dysphagia - emesis is concerning. May need evaluation for endoscopy.   Will get him his flu shot today.   We discussed disease management and progression at length today.     Return to Care: Return in about 7 weeks (around 09/07/2021).  Lenice Llamas, MD Pul[facility] and [place]  CC: Saguier, Percell Miller, [place]

## 2021-07-20 NOTE — Patient Instructions (Addendum)
Please schedule follow up scheduled with myself in 2 months.  If my schedule is not open yet, we will contact you with a reminder closer to that time.  Before your next visit I would like you to have: Full set of PFTs - 1 hour  Start taking xyzal and singulair once a day.

## 2021-08-01 ENCOUNTER — Other Ambulatory Visit: Payer: Self-pay | Admitting: Medical

## 2021-09-02 ENCOUNTER — Encounter: Payer: Self-pay | Admitting: Gastroenterology

## 2021-09-07 ENCOUNTER — Encounter: Payer: Self-pay | Admitting: Internal Medicine

## 2021-09-07 ENCOUNTER — Ambulatory Visit (INDEPENDENT_AMBULATORY_CARE_PROVIDER_SITE_OTHER): Payer: 59 | Admitting: Internal Medicine

## 2021-09-07 ENCOUNTER — Ambulatory Visit: Payer: 59 | Admitting: Internal Medicine

## 2021-09-07 ENCOUNTER — Other Ambulatory Visit: Payer: Self-pay

## 2021-09-07 VITALS — BP 118/78 | HR 92 | Temp 98.2°F | Ht 61.0 in | Wt 151.0 lb

## 2021-09-07 DIAGNOSIS — J4540 Moderate persistent asthma, uncomplicated: Secondary | ICD-10-CM | POA: Diagnosis not present

## 2021-09-07 DIAGNOSIS — R0602 Shortness of breath: Secondary | ICD-10-CM

## 2021-09-07 DIAGNOSIS — J3089 Other allergic rhinitis: Secondary | ICD-10-CM | POA: Diagnosis not present

## 2021-09-07 LAB — PULMONARY FUNCTION TEST
DL/VA % pred: 137 %
DL/VA: 5.38 ml/min/mmHg/L
DLCO cor % pred: 129 %
DLCO cor: 26.28 ml/min/mmHg
DLCO unc % pred: 129 %
DLCO unc: 26.28 ml/min/mmHg
FEF 25-75 Post: 2.46 L/sec
FEF 25-75 Pre: 2 L/sec
FEF2575-%Change-Post: 23 %
FEF2575-%Pred-Post: 109 %
FEF2575-%Pred-Pre: 88 %
FEV1-%Change-Post: 5 %
FEV1-%Pred-Post: 110 %
FEV1-%Pred-Pre: 104 %
FEV1-Post: 2.72 L
FEV1-Pre: 2.56 L
FEV1FVC-%Change-Post: 1 %
FEV1FVC-%Pred-Pre: 93 %
FEV6-%Change-Post: 4 %
FEV6-%Pred-Post: 112 %
FEV6-%Pred-Pre: 107 %
FEV6-Post: 3.55 L
FEV6-Pre: 3.42 L
FEV6FVC-%Change-Post: 0 %
FEV6FVC-%Pred-Post: 96 %
FEV6FVC-%Pred-Pre: 96 %
FVC-%Change-Post: 4 %
FVC-%Pred-Post: 116 %
FVC-%Pred-Pre: 111 %
FVC-Post: 3.57 L
FVC-Pre: 3.43 L
Post FEV1/FVC ratio: 76 %
Post FEV6/FVC ratio: 100 %
Pre FEV1/FVC ratio: 75 %
Pre FEV6/FVC Ratio: 100 %
RV % pred: 141 %
RV: 2.48 L
TLC % pred: 117 %
TLC: 6.07 L

## 2021-09-07 MED ORDER — ALBUTEROL SULFATE HFA 108 (90 BASE) MCG/ACT IN AERS: 2.0000 | INHALATION_SPRAY | Freq: Four times a day (QID) | RESPIRATORY_TRACT | 5 refills | Status: AC | PRN

## 2021-09-07 MED ORDER — FLUTICASONE-SALMETEROL 250-50 MCG/ACT IN AEPB: 1.0000 | INHALATION_SPRAY | Freq: Two times a day (BID) | RESPIRATORY_TRACT | 5 refills | Status: DC

## 2021-09-07 NOTE — Patient Instructions (Addendum)
Please schedule follow up scheduled with myself in 3 months.  If my schedule is not open yet, we will contact you with a reminder closer to that time. Please call [phone] if you haven't heard from [place] a month before.   Start taking advair 1 puff in the morning, 1 puff at night. Gargle after use.   Keep taking singulair and xyzal for allergies  By learning about asthma and how it can be controlled, you take an important step toward managing this disease. Work closely with your asthma care team to learn all you can about your asthma, how to avoid triggers, what your medications do, and how to take them correctly. With proper care, you can live free of asthma symptoms and maintain a normal, healthy lifestyle.   What is asthma? Asthma is a chronic disease that affects the airways of the lungs. During normal breathing, the bands of muscle that surround the airways are relaxed and air moves freely. During an asthma episode or "attack," there are three main changes that stop air from moving easily through the airways: The bands of muscle that surround the airways tighten and make the airways narrow. This tightening is called bronchospasm.  The lining of the airways becomes swollen or inflamed.  The cells that line the airways produce more mucus, which is thicker than normal and clogs the airways.  These three factors - bronchospasm, inflammation, and mucus production - cause symptoms such as difficulty breathing, wheezing, and coughing.  What are the most common symptoms of asthma? Asthma symptoms are not the same for everyone. They can even change from episode to episode in the same person. Also, you may have only one symptom of asthma, such as cough, but another person may have all the symptoms of asthma. It is important to know all the symptoms of asthma and to be aware that your asthma can present in any of these ways at any time. The most common symptoms include: Coughing, especially at night   Shortness of breath  Wheezing  Chest tightness, pain, or pressure   Who is affected by asthma? Asthma affects 22 million Americans; about 6 million of these are children under age 37. People who have a family history of asthma have an increased risk of developing the disease. Asthma is also more common in people who have allergies or who are exposed to tobacco smoke. However, anyone can develop asthma at any time. Some people may have asthma all of their lives, while others may develop it as adults.  What causes asthma? The airways in a person with asthma are very sensitive and react to many things, or "triggers." Contact with these triggers causes asthma symptoms. One of the most important parts of asthma control is to identify your triggers and then avoid them when possible. The only trigger you do not want to avoid is exercise. Pre-treatment with medicines before exercise can allow you to stay active yet avoid asthma symptoms. Common asthma triggers include: Infections (colds, viruses, flu, sinus infections)  Exercise  Weather (changes in temperature and/or humidity, cold air)  Tobacco smoke  Allergens (dust mites, pollens, pets, mold spores, cockroaches, and sometimes foods)  Irritants (strong odors from cleaning products, perfume, wood smoke, air pollution)  Strong emotions such as crying or laughing hard  Some medications   How is asthma diagnosed? To diagnose asthma, your doctor will first review your medical history, family history, and symptoms. Your doctor will want to know any past history of breathing  problems you may have had, as well as a family history of asthma, allergies, eczema (a bumpy, itchy skin rash caused by allergies), or other lung disease. It is important that you describe your symptoms in detail (cough, wheeze, shortness of breath, chest tightness), including when and how often they occur. The doctor will perform a physical examination and listen to your heart and  lungs. He or she may also order breathing tests, allergy tests, blood tests, and chest and sinus X-rays. The tests will find out if you do have asthma and if there are any other conditions that are contributing factors.  How is asthma treated? Asthma can be controlled, but not cured. It is not normal to have frequent symptoms, trouble sleeping, or trouble completing tasks. Appropriate asthma care will prevent symptoms and visits to the emergency room and hospital. Asthma medicines are one of the mainstays of asthma treatment. The drugs used to treat asthma are explained below.  Anti-inflammatories: These are the most important drugs for most people with asthma. Anti-inflammatory drugs reduce swelling and mucus production in the airways. As a result, airways are less sensitive and less likely to react to triggers. These medications need to be taken daily and may need to be taken for several weeks before they begin to control asthma. Anti-inflammatory medicines lead to fewer symptoms, better airflow, less sensitive airways, less airway damage, and fewer asthma attacks. If taken every day, they CONTROL or prevent asthma symptoms.   Bronchodilators: These drugs relax the muscle bands that tighten around the airways. This action opens the airways, letting more air in and out of the lungs and improving breathing. Bronchodilators also help clear mucus from the lungs. As the airways open, the mucus moves more freely and can be coughed out more easily. In short-acting forms, bronchodilators RELIEVE or stop asthma symptoms by quickly opening the airways and are very helpful during an asthma episode. In long-acting forms, bronchodilators provide CONTROL of asthma symptoms and prevent asthma episodes.  Asthma drugs can be taken in a variety of ways. Inhaling the medications by using a metered dose inhaler, dry powder inhaler, or nebulizer is one way of taking asthma medicines. Oral medicines (pills or liquids you  swallow) may also be prescribed.  Asthma severity Asthma is classified as either "intermittent" (comes and goes) or "persistent" (lasting). Persistent asthma is further described as being mild, moderate, or severe. The severity of asthma is based on how often you have symptoms both during the day and night, as well as by the results of lung function tests and by how well you can perform activities. The "severity" of asthma refers to how "intense" or "strong" your asthma is.  Asthma control Asthma control is the goal of asthma treatment. Regardless of your asthma severity, it may or may not be controlled. Asthma control means: You are able to do everything you want to do at work and home  You have no (or minimal) asthma symptoms  You do not wake up from your sleep or earlier than usual in the morning due to asthma  You rarely need to use your reliever medicine (inhaler)  Another major part of your treatment is that you are happy with your asthma care and believe your asthma is controlled.  Monitoring symptoms A key part of treatment is keeping track of how well your lungs are working. Monitoring your symptoms  what they are, how and when they happen, and how severe they are  is an important part of being  able to control your asthma.  Sometimes asthma is monitored using a peak flow meter. A peak flow (PF) meter measures how fast the air comes out of your lungs. It can help you know when your asthma is getting worse, sometimes even before you have symptoms. By taking daily peak flow readings, you can learn when to adjust medications to keep asthma under good control. It is also used to create your asthma action plan (see below). Your doctor can use your peak flow readings to adjust your treatment plan in some cases.  Asthma Action Plan Based on your history and asthma severity, you and your doctor will develop a care plan called an asthma action plan. The asthma action plan describes when and  how to use your medicines, actions to take when asthma worsens, and when to seek emergency care. Make sure you understand this plan. If you do not, ask your asthma care provider any questions you may have. Your asthma action plan is one of the keys to controlling asthma. Keep it readily available to remind you of what you need to do every day to control asthma and what you need to do when symptoms occur.  Goals of asthma therapy These are the goals of asthma treatment: Live an active, normal life  Prevent chronic and troublesome symptoms  Attend work or school every day  Perform daily activities without difficulty  Stop urgent visits to the doctor, emergency department, or hospital  Use and adjust medications to control asthma with few or no side effects

## 2021-09-07 NOTE — Progress Notes (Signed)
PFT done today. 

## 2021-09-07 NOTE — Progress Notes (Signed)
Timothy Briggs    [id number]    May 05, 1961  Primary Care Physician:Saguier, Iris Pert Date of Appointment: 09/07/2021 Established Patient Visit  Chief complaint:   Chief Complaint  Patient presents with   Follow-up    PFT performed today.  Pt states he is still coughing but states it is better than before.     HPI: Timothy Briggs is a 60 y.o. man who presents for asthma.   Interval Updates: Here for follow up after PFTS.   Cough somewhat improved with xyzal and   Has not been taking symbicort - ran out.  Wife notes previous  episodes of asthma attacks treated with prednisione and significant improvement.   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    I have reviewed the patient's family social and past medical history and updated as appropriate.   Past Medical History:  Diagnosis Date   Allergy    Asthma    COPD (chronic obstructive pulmonary disease) (Timothy Briggs)    beginning of COPD along with Asthma   Hyperlipidemia    not on medicine diet controlled    Past Surgical History:  Procedure Laterality Date   COLONOSCOPY     in [place] 10+ yrs ago    Family History  Problem Relation Age of Onset   Colon cancer Neg Hx    Colon polyps Neg Hx    Esophageal cancer Neg Hx    Rectal cancer Neg Hx    Stomach cancer Neg Hx     Social History   Occupational History   Occupation: Architectural technologist.  Tobacco Use   Smoking status: Former    Packs/day: 0.50    Years: 32.00    Pack years: 16.00    Types: Cigarettes    Start date: 07/05/1980    Quit date: 2017    Years since quitting: 5.9   Smokeless tobacco: Never  Vaping Use   Vaping Use: Never used  Substance and Sexual Activity   Alcohol use: No   Drug use: No   Sexual activity: Not on file     Physical Exam: Blood pressure 118/78, pulse 92, temperature 98.2 F (36.8 C), temperature source Oral, height 5\' 1"  (1.549 m), weight 151 lb (68.5 kg),  SpO2 96 %.  Gen:      No acute distress ENT:  no nasal polyps, mucus membranes moist Lungs:    No increased respiratory effort, symmetric chest wall excursion, clear to auscultation bilaterally, no wheezes or crackles CV:         Regular rate and rhythm; no murmurs, rubs, or gallops.  No pedal edema   Data Reviewed: Imaging: I have personally reviewed the chest xray feb 2022 - no acute process  PFTs:  PFT Results Latest Ref Rng & Units 09/07/2021  FVC-Pre L 3.43  FVC-Predicted Pre % 111  FVC-Post L 3.57  FVC-Predicted Post % 116  Pre FEV1/FVC % % 75  Post FEV1/FCV % % 76  FEV1-Pre L 2.56  FEV1-Predicted Pre % 104  FEV1-Post L 2.72  DLCO uncorrected ml/min/mmHg 26.28  DLCO UNC% % 129  DLCO corrected ml/min/mmHg 26.28  DLCO COR %Predicted % 129  DLVA Predicted % 137  TLC L 6.07  TLC % Predicted % 117  RV % Predicted % 141   I have personally reviewed the patient's PFTs and normal spirometry - but shows hyperinflation, air trapping and elevated dlco .  Labs: Lab Results  Component Value Date   WBC 7.5 07/05/2017   HGB 14.4 07/05/2017   HCT 45.1 07/05/2017   MCV 87 07/05/2017   PLT 272 07/05/2017   Lab Results  Component Value Date   NA 139 03/09/2021   K 4.2 03/09/2021   CL 102 03/09/2021   CO2 29 03/09/2021    Immunization status: Immunization History  Administered Date(s) Administered   Influenza,inj,Quad PF,6+ Mos 07/05/2017, 07/20/2021   PFIZER(Purple Top)SARS-COV-2 Vaccination 12/31/2019, 01/28/2020   Tdap 07/05/2017      Assessment:  Chronic Cough History of Tobacco use Probable asthma Allergic rhinitis  Plan/Recommendations:  PFTs and symptoms described are in line with asthma.  Would start advair 1 puff bid. Gargle after use Continue montelukast and xyzal Rescue inhaler also sent to pharmacy for as needed symptoms of chest tightness, cough, wheezing.   Return to Care: Return in about 3 months (around 12/06/2021).   Timothy Llamas,  MD Pulmonary and [place]

## 2021-10-26 ENCOUNTER — Other Ambulatory Visit: Payer: Self-pay | Admitting: Medical

## 2021-10-26 ENCOUNTER — Ambulatory Visit: Payer: 59 | Admitting: Gastroenterology

## 2021-10-26 ENCOUNTER — Other Ambulatory Visit (INDEPENDENT_AMBULATORY_CARE_PROVIDER_SITE_OTHER): Payer: 59

## 2021-10-26 ENCOUNTER — Encounter: Payer: Self-pay | Admitting: Gastroenterology

## 2021-10-26 VITALS — BP 142/70 | HR 67 | Ht 61.0 in | Wt 147.0 lb

## 2021-10-26 DIAGNOSIS — K219 Gastro-esophageal reflux disease without esophagitis: Secondary | ICD-10-CM

## 2021-10-26 DIAGNOSIS — R1310 Dysphagia, unspecified: Secondary | ICD-10-CM

## 2021-10-26 DIAGNOSIS — D649 Anemia, unspecified: Secondary | ICD-10-CM | POA: Diagnosis not present

## 2021-10-26 DIAGNOSIS — Z860100 Personal history of colon polyps, unspecified: Secondary | ICD-10-CM

## 2021-10-26 LAB — CBC WITH DIFFERENTIAL/PLATELET
Basophils Absolute: 0 10*3/uL (ref 0.0–0.1)
Basophils Relative: 0.1 % (ref 0.0–3.0)
Eosinophils Absolute: 0 10*3/uL (ref 0.0–0.7)
Eosinophils Relative: 0 % (ref 0.0–5.0)
HCT: 39.9 % (ref 39.0–52.0)
Hemoglobin: 12.7 g/dL — ABNORMAL LOW (ref 13.0–17.0)
Lymphocytes Relative: 15.8 % (ref 12.0–46.0)
Lymphs Abs: 2.1 10*3/uL (ref 0.7–4.0)
MCHC: 31.9 g/dL (ref 30.0–36.0)
MCV: 88.1 fl (ref 78.0–100.0)
Monocytes Absolute: 0.8 10*3/uL (ref 0.1–1.0)
Monocytes Relative: 5.8 % (ref 3.0–12.0)
Neutro Abs: 10.5 10*3/uL — ABNORMAL HIGH (ref 1.4–7.7)
Neutrophils Relative %: 78.3 % — ABNORMAL HIGH (ref 43.0–77.0)
Platelets: 297 10*3/uL (ref 150.0–400.0)
RBC: 4.53 Mil/uL (ref 4.22–5.81)
RDW: 15 % (ref 11.5–15.5)
WBC: 13.4 10*3/uL — ABNORMAL HIGH (ref 4.0–10.5)

## 2021-10-26 LAB — IBC + FERRITIN
Ferritin: 10.6 ng/mL — ABNORMAL LOW (ref 22.0–322.0)
Iron: 10 ug/dL — ABNORMAL LOW (ref 42–165)
Saturation Ratios: 2.2 % — ABNORMAL LOW (ref 20.0–50.0)
TIBC: 449.4 ug/dL (ref 250.0–450.0)
Transferrin: 321 mg/dL (ref 212.0–360.0)

## 2021-10-26 MED ORDER — OMEPRAZOLE 20 MG PO CPDR: 20.0000 mg | DELAYED_RELEASE_CAPSULE | Freq: Every day | ORAL | 1 refills | Status: DC

## 2021-10-26 NOTE — Patient Instructions (Addendum)
If you are age 61 or older, your body mass index should be between 23-30. Your Body mass index is 27.78 kg/m. If this is out of the aforementioned range listed, please consider follow up with your Primary Care Provider.  If you are age 74 or younger, your body mass index should be between 19-25. Your Body mass index is 27.78 kg/m. If this is out of the aformentioned range listed, please consider follow up with your Primary Care Provider.   ________________________________________________________  The [facility] providers would like to encourage you to use [facility] to communicate with providers for non-urgent requests or questions.  Due to long hold times on the telephone, sending your provider a message by [facility] may be a faster and more efficient way to get a response.  Please allow 48 business hours for a response.  Please remember that this is for non-urgent requests.  _______________________________________________________  Timothy Briggs have been scheduled for an endoscopy. Please follow written instructions given to you at your visit today. If you use inhalers (even only as needed), please bring them with you on the day of your procedure.   Please go to the lab in the basement of our building to have lab work done as you leave today. Hit "B" for basement when you get on the elevator.  When the doors open the lab is on your left.  We will call you with the results. Thank you.   We have sent the following medications to your pharmacy for you to pick up at your convenience: Omeprazole 20 mg: Take once daily   Thank you for entrusting me with your care and for choosing [facility], Dr. Bowler Cellar

## 2021-10-26 NOTE — Progress Notes (Signed)
HPI :  61 year old male with a history of asthma, allergies, history of colon polyps, referred by Lutricia Feil, MD for dysphagia.  I last saw the patient in November 2018 at the time of his last colonoscopy, he had 1 small adenoma on that exam.  He is accompanied by his wife today.  His primary complaint is that of dysphagia.  States this has been going on for a few years or so.  Feels food gets stuck in his mid chest with certain swallows, tries to drink fluids to push it down.  Sometimes it goes down but other times he has to vomit it back up.  States he can also have dysphagia to liquids as well at times.  It does not occur with every meal, happens occasionally and sporadically without clear triggers.  He does have some pyrosis at baseline for which she takes Pepcid as needed, he is not clear how much this helps him.  He denies any postprandial pain in his abdomen.  He denies any nausea or vomiting otherwise.  Denies any weight loss.  He denies any problems with his bowels or blood in his stool.  He does have some hemorrhoids that bother him periodically but not routinely.  He has never had a prior endoscopy.  States outside of the past few years has never really been a problem for him.  On review of his labs, last CBC he had was in December 2021, hemoglobin was 10.7 with an MCV of 69.  He also had hemoglobin of 10.3 in November 2021, before that his last CBC was in February 2020 and his hemoglobin was 15.7.  He does not think he has had any follow-up for his blood counts in 2021.  Otherwise feels well without any cardiopulmonary complaints other than occasional cough associated with his asthma.  He states he takes his inhalers for asthma.  He works as a Architectural technologist.   Colonoscopy 07/26/2017 - The perianal and digital rectal examinations were normal. - A diminutive polyp was found in the transverse colon. The polyp was sessile. The polyp was removed with a cold biopsy forceps. Resection and retrieval  were complete. - A 4 mm polyp was found in the descending colon. The polyp was sessile. The polyp was removed with a cold snare. Resection and retrieval were complete. - Scattered medium-mouthed diverticula were found in the entire colon. - Internal hemorrhoids were found during retroflexion. - The exam was otherwise without abnormality.   Surgical [P], descending and transverse, polyp (2) - TUBULAR ADENOMA (ONE FRAGMENT). - BENIGN LYMPHOID POLYP (ONE FRAGMENT). - NO HIGH GRADE DYSPLASIA OR MALIGNANCY.     Past Medical History:  Diagnosis Date   Allergy    Asthma    COPD (chronic obstructive pulmonary disease) ([place])    beginning of COPD along with Asthma   Hyperlipidemia    not on medicine diet controlled     Past Surgical History:  Procedure Laterality Date   COLONOSCOPY     in [place] 10+ yrs ago   Family History  Problem Relation Age of Onset   Colon cancer Neg Hx    Colon polyps Neg Hx    Esophageal cancer Neg Hx    Rectal cancer Neg Hx    Stomach cancer Neg Hx    Social History   Tobacco Use   Smoking status: Former    Packs/day: 0.50    Years: 32.00    Pack years: 16.00    Types: Cigarettes  Start date: 07/05/1980    Quit date: 2017    Years since quitting: 6.1   Smokeless tobacco: Never  Vaping Use   Vaping Use: Never used  Substance Use Topics   Alcohol use: No   Drug use: No   Current Outpatient Medications  Medication Sig Dispense Refill   albuterol (VENTOLIN HFA) 108 (90 Base) MCG/ACT inhaler Inhale 2 puffs into the lungs every 6 (six) hours as needed for wheezing or shortness of breath. 1 each 5   atorvastatin (LIPITOR) 40 MG tablet Take 1 tablet by mouth once daily 90 tablet 0   ergocalciferol (VITAMIN D2) 1.25 MG (50000 UT) capsule Take 1 capsule by mouth once a week.     famotidine (PEPCID) 20 MG tablet Take 1 tablet (20 mg total) by mouth daily. 30 tablet 0   fluticasone-salmeterol (ADVAIR DISKUS) 250-50 MCG/ACT AEPB Inhale 1  puff into the lungs in the morning and at bedtime. 1 each 5   levocetirizine (XYZAL) 5 MG tablet Take 1 tablet (5 mg total) by mouth every evening. 30 tablet 3   montelukast (SINGULAIR) 10 MG tablet Take 1 tablet (10 mg total) by mouth at bedtime. 30 tablet 11   No current facility-administered medications for this visit.   No Known Allergies   Review of Systems: All systems reviewed and negative except where noted in HPI.    Lab Results  Component Value Date   WBC 7.5 07/05/2017   HGB 14.4 07/05/2017   HCT 45.1 07/05/2017   MCV 87 07/05/2017   PLT 272 07/05/2017   More recent labs in Care-everywhere  Physical Exam: BP (!) 142/70    Pulse 67    Ht 5\' 1"  (1.549 m)    Wt 147 lb (66.7 kg)    BMI 27.78 kg/m  Constitutional: Pleasant,well-developed, male in no acute distress. HEENT: Normocephalic and atraumatic. Conjunctivae are normal. No scleral icterus. Neck supple.  Cardiovascular: Normal rate, regular rhythm.  Pulmonary/chest: Effort normal and breath sounds normal.  Abdominal: Soft, nondistended, nontender.  There are no masses palpable.  Extremities: no edema Lymphadenopathy: No cervical adenopathy noted. Neurological: Alert and oriented to person place and time. Skin: Skin is warm and dry. No rashes noted. Psychiatric: Normal mood and affect. Behavior is normal.   ASSESSMENT AND PLAN: 61 year old male here for reassessment of the following:  Dysphagia GERD Anemia History of colon polyps  Ongoing intermittent dysphagia as outlined above in the setting of ongoing reflux symptoms.  Pepcid has not helped too much.  Discussed differential diagnosis with him for dysphagia.  Quite possible he has a peptic stricture driving this.  I recommend an EGD to further evaluate and treat with dilation if amenable pending findings.  I discussed what EGD is, risks and benefits of the procedure and anesthesia, he wants to proceed.  In the interim we will stop Pepcid and transition to  omeprazole 20 mg daily to see if this will provide better control for his reflux.  He can increase omeprazole to twice daily if needed.  Of note on review of labs he had a microcytic anemia x2 back in 2021, no follow-up labs since then.  I will check a CBC with iron panel today to make sure okay.  I reviewed his last colonoscopy with him, based on updated surveillance guidelines I do not think he needs another colonoscopy until 7 years from his last exam for 1 small adenoma removed, unless he has iron deficiency on his labs. They agree with the plan.  Plan: - schedule for EGD - stop pepcid - start omeprazole 20mg  / day, can increase to BID if needed - CBC, TIBC / ferritin panel - further recommendations pending results  Jolly Mango, MD [facility]  CC: Spero Geralds, MD

## 2021-10-28 ENCOUNTER — Other Ambulatory Visit: Payer: Self-pay

## 2021-10-28 DIAGNOSIS — D649 Anemia, unspecified: Secondary | ICD-10-CM

## 2021-10-28 MED ORDER — FERROUS SULFATE 325 (65 FE) MG PO TBEC: 325.0000 mg | DELAYED_RELEASE_TABLET | Freq: Every day | ORAL | 0 refills | Status: AC

## 2021-11-04 ENCOUNTER — Other Ambulatory Visit: Payer: Self-pay | Admitting: Medical

## 2021-11-16 ENCOUNTER — Ambulatory Visit (INDEPENDENT_AMBULATORY_CARE_PROVIDER_SITE_OTHER): Payer: 59 | Admitting: Medical

## 2021-11-16 VITALS — BP 129/80 | HR 60 | Temp 98.2°F | Resp 18 | Ht 61.0 in | Wt 152.4 lb

## 2021-11-16 DIAGNOSIS — J4520 Mild intermittent asthma, uncomplicated: Secondary | ICD-10-CM | POA: Diagnosis not present

## 2021-11-16 DIAGNOSIS — J40 Bronchitis, not specified as acute or chronic: Secondary | ICD-10-CM | POA: Diagnosis not present

## 2021-11-16 DIAGNOSIS — R059 Cough, unspecified: Secondary | ICD-10-CM

## 2021-11-16 DIAGNOSIS — J302 Other seasonal allergic rhinitis: Secondary | ICD-10-CM | POA: Diagnosis not present

## 2021-11-16 DIAGNOSIS — H6690 Otitis media, unspecified, unspecified ear: Secondary | ICD-10-CM

## 2021-11-16 LAB — POC COVID19 BINAXNOW: SARS Coronavirus 2 Ag: NEGATIVE

## 2021-11-16 MED ORDER — AZITHROMYCIN 250 MG PO TABS: ORAL_TABLET | ORAL | 0 refills | Status: AC

## 2021-11-16 MED ORDER — METHYLPREDNISOLONE 4 MG PO TABS: ORAL_TABLET | ORAL | 0 refills | Status: AC

## 2021-11-16 MED ORDER — FLUTICASONE PROPIONATE 50 MCG/ACT NA SUSP: 2.0000 | Freq: Every day | NASAL | 1 refills | Status: AC

## 2021-11-16 NOTE — Addendum Note (Signed)
Addended by: Anabel Halon on: 11/16/2021 01:51 PM   Modules accepted: Orders

## 2021-11-16 NOTE — Patient Instructions (Addendum)
Recent allergic rhinitis with asthma flare(for both). Bronchitis type symptoms along with and on exam rt ear appears red/infected.  Continue allergy meds and asthma meds. Continue xyzal, singulari, advair and albuterol  Add flonase nasal spray, 6 day taper medrol and azithromycin antibiotic.   If wheezing worsen or get chest congested despite the above then get cxr.  Follow up in 2 weeks or sooner if needed.   2 weeks wellness exam fasting.

## 2021-11-16 NOTE — Progress Notes (Signed)
° °  Subjective:    Patient ID: Timothy Briggs, male    DOB: 06/07/1961, 61 y.o.   MRN: [id number]  HPI  Pt in for one week nasal congestion, runny nose, sneezing and some productive cough. When blows nose gets some mucus.  No sob. Has been wheezing at night. Hx of asthma.  Pt is on advair twice day and has used albuterol once at night recently.  Pt also on xyzal and montelukast.  He has some year round allergies. No diabetes.  Covid test negative today.    Review of Systems See hpi    Objective:   Physical Exam  General- No acute distress. Pleasant patient. Neck- Full range of motion, no jvd Lungs- Clear, even and unlabored. Heart- regular rate and rhythm. Neurologic- CNII- XII grossly intact.  Heent- boggy turbinates. Mild pnd. Rt tm mild red.     Assessment & Plan:   Patient Instructions  Recent allergic rhinitis with asthma flare. Bronchitis type symptoms along with and on exam rt ear appears red/infected.  Continue allergy meds and asthma meds. Continue xyzal, singulari, advair and albuterol  Add flonase nasal spray, 6 day taper medrol and azithromycin antibiotic.   If wheezing worsen or get chest congested despite the above then get cxr.  Follow up in 2 weeks or sooner if needed.   Mackie Pai, PA-C

## 2021-11-23 ENCOUNTER — Ambulatory Visit: Payer: 59 | Admitting: Internal Medicine

## 2021-11-25 ENCOUNTER — Telehealth: Payer: Self-pay

## 2021-11-25 NOTE — Telephone Encounter (Signed)
Attempted to reach pt via interpreter services Timothy Briggs, Interpreter ID: [phone]). Pt has been advised that he is due for repeat labs at this time. No appointment is necessary. Patient is aware that he can stop by the lab in the basement at his convenience between 7:30 AM - 5 PM, Monday through Friday. Pt states that he is off on Monday and plans to complete blood work at that time. He wanted to know if he needed to fast for his EGD. I told him that he should follow the instructions that were given to him on 10/26/21. He reports that he has the instructions available. Patient verbalized understanding and had no concerns at the end of the call.  ? ?

## 2021-11-25 NOTE — Telephone Encounter (Signed)
-----   Message from Yevette Edwards, RN sent at 10/28/2021 11:27 AM EST ----- ?Regarding: Labs ?CBC, order in epic. ? ?

## 2021-11-30 ENCOUNTER — Ambulatory Visit (AMBULATORY_SURGERY_CENTER): Payer: 59 | Admitting: Gastroenterology

## 2021-11-30 ENCOUNTER — Encounter: Payer: Self-pay | Admitting: Gastroenterology

## 2021-11-30 ENCOUNTER — Other Ambulatory Visit: Payer: Self-pay

## 2021-11-30 VITALS — BP 96/70 | HR 72 | Temp 98.3°F | Resp 10 | Ht 61.0 in | Wt 147.0 lb

## 2021-11-30 DIAGNOSIS — K2960 Other gastritis without bleeding: Secondary | ICD-10-CM | POA: Diagnosis not present

## 2021-11-30 DIAGNOSIS — D509 Iron deficiency anemia, unspecified: Secondary | ICD-10-CM

## 2021-11-30 DIAGNOSIS — R1310 Dysphagia, unspecified: Secondary | ICD-10-CM | POA: Diagnosis present

## 2021-11-30 DIAGNOSIS — K3189 Other diseases of stomach and duodenum: Secondary | ICD-10-CM | POA: Diagnosis not present

## 2021-11-30 DIAGNOSIS — K317 Polyp of stomach and duodenum: Secondary | ICD-10-CM

## 2021-11-30 MED ORDER — SODIUM CHLORIDE 0.9 % IV SOLN: 500.0000 mL | INTRAVENOUS | Status: DC

## 2021-11-30 MED ORDER — OMEPRAZOLE 20 MG PO CPDR: 20.0000 mg | DELAYED_RELEASE_CAPSULE | Freq: Two times a day (BID) | ORAL | 3 refills | Status: AC

## 2021-11-30 NOTE — Op Note (Signed)
[facility] ?Patient Name: Timothy Briggs ?Procedure Date: 11/30/2021 7:09 AM ?MRN: [id number] ?Endoscopist: Carlota Raspberry. Havery Moros , MD ?Age: 61 ?Referring MD:  ?Date of Birth: Apr 29, 1961 ?Gender: Male ?Account #: [id number] ?Procedure:                Upper GI endoscopy ?Indications:              Iron deficiency anemia, Dysphagia ?Medicines:                Monitored Anesthesia Care ?Procedure:                Pre-Anesthesia Assessment: ?                          - Prior to the procedure, a History and Physical  ?                          was performed, and patient medications and  ?                          allergies were reviewed. The patient's tolerance of  ?                          previous anesthesia was also reviewed. The risks  ?                          and benefits of the procedure and the sedation  ?                          options and risks were discussed with the patient.  ?                          All questions were answered, and informed consent  ?                          was obtained. Prior Anticoagulants: The patient has  ?                          taken no previous anticoagulant or antiplatelet  ?                          agents. ASA Grade Assessment: II - A patient with  ?                          mild systemic disease. After reviewing the risks  ?                          and benefits, the patient was deemed in  ?                          satisfactory condition to undergo the procedure. ?                          After obtaining informed consent, the endoscope was  ?  passed under direct vision. Throughout the  ?                          procedure, the patient's blood pressure, pulse, and  ?                          oxygen saturations were monitored continuously. The  ?                          Endoscope was introduced through the mouth, and  ?                          advanced to the second part of duodenum. The upper  ?                          GI endoscopy was  accomplished without difficulty.  ?                          The patient tolerated the procedure well. ?Scope In: ?Scope Out: ?Findings:                 Esophagogastric landmarks were identified: the  ?                          Z-line was found at 37 cm, the gastroesophageal  ?                          junction was found at 37 cm and the upper extent of  ?                          the gastric folds was found at 37 cm from the  ?                          incisors. ?                          The exam of the esophagus was otherwise normal. No  ?                          overt stenosis / stricture noted. ?                          A guidewire was placed and the scope was withdrawn.  ?                          Empiric dilation was performed in the entire  ?                          esophagus with a Savary dilator with mild  ?                          resistance at 17 mm and 18 mm. Relook endoscopy  ?  showed some heme coming from appropriate mucosal  ?                          wrent noted just inferior to the UES ?                          Localized inflammation characterized by erythema,  ?                          friability and granularity was found in the gastric  ?                          fundus. ?                          A few small sessile polyps were found in the  ?                          gastric fundus and in the gastric body. One  ?                          representative polyp was removed with a cold biopsy  ?                          forceps that was located within the area of  ?                          gastritis. Resection and retrieval were complete. ?                          The exam of the stomach was otherwise normal. ?                          Biopsies were taken with a cold forceps in the  ?                          gastric body, at the incisura and in the gastric  ?                          antrum for Helicobacter pylori testing. ?                          The duodenal  bulb and second portion of the  ?                          duodenum were normal. ?Complications:            No immediate complications. Estimated blood loss:  ?                          Minimal. ?Estimated Blood Loss:     Estimated blood loss was minimal. ?Impression:               - Esophagogastric landmarks identified. ?                          -  Normal esophagus otherwise - empirically dilated  ?                          to 47m with good result. Subtle stenosis just  ?                          inferior to UES was dilated. ?                          - Gastritis. This may related to gastitis. ?                          - A few gastric polyps. One representative polyp  ?                          resected and retrieved. ?                          - Normal duodenal bulb and second portion of the  ?                          duodenum. ?                          - Biopsies were taken with a cold forceps for  ?                          Helicobacter pylori testing. ?Recommendation:           - Patient has a contact number available for  ?                          emergencies. The signs and symptoms of potential  ?                          delayed complications were discussed with the  ?                          patient. Return to normal activities tomorrow.  ?                          Written discharge instructions were provided to the  ?                          patient. ?                          - Post dilation diet. ?                          - Continue present medications. ?                          - Increase omeprazole to twice daily dosing ?                          - Minimize  NSAID use ?                          - Await pathology results ?                          - Consideration for colonoscopy to complete  ?                          evaluation for iron deficiency ?Remo Lipps P. Kadence Mikkelson, MD ?11/30/2021 7:59:17 AM ?This report has been signed electronically. ?

## 2021-11-30 NOTE — Progress Notes (Signed)
Pt in recovery with monitors in place, VSS. Report given to receiving RN. Bite guard was placed with pt awake to ensure comfort. No dental or soft tissue damage noted. 

## 2021-11-30 NOTE — Progress Notes (Signed)
[facility] History and Physical ? ? ?Primary Care Physician:  Mackie Pai, PA-C ? ? ?Reason for Procedure:   Dysphagia, IDA ? ?Plan:    EGD with possible dilation ? ? ? ? ?HPI: Timothy Briggs is a 61 y.o. male  here for EGD to evaluate dysphagia and history of iron deficiency. Colonoscopy 2018 without high risk pathology. I placed him on omeprazole since our clinic visit but no appreciable improvement in symptoms. Now on oral iron and tolerating it well. Otherwise feels well without any cardiopulmonary symptoms.  ? ? ?Past Medical History:  ?Diagnosis Date  ? Allergy   ? Asthma   ? COPD (chronic obstructive pulmonary disease) ([place])   ? beginning of COPD along with Asthma  ? Hyperlipidemia   ? not on medicine diet controlled  ? ? ?Past Surgical History:  ?Procedure Laterality Date  ? COLONOSCOPY    ? in [place] 10+ yrs ago  ? ? ?Prior to Admission medications   ?Medication Sig Start Date End Date Taking? Authorizing Provider  ?albuterol (VENTOLIN HFA) 108 (90 Base) MCG/ACT inhaler Inhale 2 puffs into the lungs every 6 (six) hours as needed for wheezing or shortness of breath. 09/07/21  Yes Spero Geralds, MD  ?atorvastatin (LIPITOR) 40 MG tablet Take 1 tablet by mouth once daily 11/04/21  Yes Saguier, Percell Miller, PA-C  ?ergocalciferol (VITAMIN D2) 1.25 MG (50000 UT) capsule Take 1 capsule by mouth once a week. 10/26/18  Yes [provider]  ?famotidine (PEPCID) 20 MG tablet Take 1 tablet (20 mg total) by mouth daily. 03/09/21  Yes Saguier, Percell Miller, PA-C  ?ferrous sulfate 325 (65 FE) MG EC tablet Take 1 tablet (325 mg total) by mouth daily with breakfast. 10/28/21  Yes Zigmond Trela, Carlota Raspberry, MD  ?fluticasone (FLONASE) 50 MCG/ACT nasal spray Place 2 sprays into both nostrils daily. 11/16/21  Yes Saguier, Percell Miller, PA-C  ?montelukast (SINGULAIR) 10 MG tablet Take 1 tablet (10 mg total) by mouth at bedtime. 07/20/21  Yes Spero Geralds, MD  ?omeprazole (PRILOSEC) 20 MG capsule Take 1 capsule (20 mg  total) by mouth daily. 10/26/21  Yes Kayden Hutmacher, Carlota Raspberry, MD  ?fluticasone-salmeterol (ADVAIR DISKUS) 250-50 MCG/ACT AEPB Inhale 1 puff into the lungs in the morning and at bedtime. 09/07/21   Spero Geralds, MD  ?levocetirizine (XYZAL) 5 MG tablet Take 1 tablet (5 mg total) by mouth every evening. 07/20/21   Spero Geralds, MD  ?methylPREDNISolone (MEDROL) 4 MG tablet Standard 6 day taper dose ?Patient not taking: Reported on 11/30/2021 11/16/21   Saguier, Percell Miller, PA-C  ?budesonide-formoterol [facility]) 160-4.5 MCG/ACT inhaler Inhale 2 puffs into the lungs 2 (two) times daily. 03/09/21 09/07/21  Saguier, Percell Miller, PA-C  ? ? ?Current Outpatient Medications  ?Medication Sig Dispense Refill  ? albuterol (VENTOLIN HFA) 108 (90 Base) MCG/ACT inhaler Inhale 2 puffs into the lungs every 6 (six) hours as needed for wheezing or shortness of breath. 1 each 5  ? atorvastatin (LIPITOR) 40 MG tablet Take 1 tablet by mouth once daily 90 tablet 0  ? ergocalciferol (VITAMIN D2) 1.25 MG (50000 UT) capsule Take 1 capsule by mouth once a week.    ? famotidine (PEPCID) 20 MG tablet Take 1 tablet (20 mg total) by mouth daily. 30 tablet 0  ? ferrous sulfate 325 (65 FE) MG EC tablet Take 1 tablet (325 mg total) by mouth daily with breakfast. 30 tablet 0  ? fluticasone (FLONASE) 50 MCG/ACT nasal spray Place 2 sprays into both nostrils daily. [place]  g 1  ? montelukast (SINGULAIR) 10 MG tablet Take 1 tablet (10 mg total) by mouth at bedtime. 30 tablet 11  ? omeprazole (PRILOSEC) 20 MG capsule Take 1 capsule (20 mg total) by mouth daily. 90 capsule 1  ? fluticasone-salmeterol (ADVAIR DISKUS) 250-50 MCG/ACT AEPB Inhale 1 puff into the lungs in the morning and at bedtime. 1 each 5  ? levocetirizine (XYZAL) 5 MG tablet Take 1 tablet (5 mg total) by mouth every evening. 30 tablet 3  ? methylPREDNISolone (MEDROL) 4 MG tablet Standard 6 day taper dose (Patient not taking: Reported on 11/30/2021) 21 tablet 0  ? ?Current Facility-Administered Medications   ?Medication Dose Route Frequency Provider Last Rate Last Admin  ? 0.9 %  sodium chloride infusion  500 mL Intravenous Continuous Gerome Kokesh, Carlota Raspberry, MD      ? ? ?Allergies as of 11/30/2021  ? (No Known Allergies)  ? ? ?Family History  ?Problem Relation Age of Onset  ? Colon cancer Neg Hx   ? Colon polyps Neg Hx   ? Esophageal cancer Neg Hx   ? Rectal cancer Neg Hx   ? Stomach cancer Neg Hx   ? ? ?Social History  ? ?Socioeconomic History  ? Marital status: Divorced  ?  Spouse name: Not on file  ? Number of children: Not on file  ? Years of education: Not on file  ? Highest education level: Not on file  ?Occupational History  ? Occupation: Architectural technologist.  ?Tobacco Use  ? Smoking status: Former  ?  Packs/day: 0.50  ?  Years: 32.00  ?  Pack years: 16.00  ?  Types: Cigarettes  ?  Start date: 07/05/1980  ?  Quit date: 2017  ?  Years since quitting: 6.1  ? Smokeless tobacco: Never  ?Vaping Use  ? Vaping Use: Never used  ?Substance and Sexual Activity  ? Alcohol use: No  ? Drug use: No  ? Sexual activity: Not on file  ?Other Topics Concern  ? Not on file  ?Social History Narrative  ? Not on file  ? ?Social Determinants of Health  ? ?Financial Resource Strain: Not on file  ?Food Insecurity: Not on file  ?Transportation Needs: Not on file  ?Physical Activity: Not on file  ?Stress: Not on file  ?Social Connections: Not on file  ?Intimate Partner Violence: Not on file  ? ? ?Review of Systems: ?All other review of systems negative except as mentioned in the HPI. ? ?Physical Exam: ?Vital signs ?BP 137/70   Pulse 79   Temp 98.3 ?F (36.8 ?C)   Ht '5\' 1"'$  (1.549 m)   Wt 147 lb (66.7 kg)   SpO2 96%   BMI 27.78 kg/m?  ? ?General:   Alert,  Well-developed, pleasant and cooperative in NAD ?Lungs:  Clear throughout to auscultation.   ?Heart:  Regular rate and rhythm ?Abdomen:  Soft, nontender and nondistended.   ?Neuro/Psych:  Alert and cooperative. Normal mood and affect. A and O x 3 ? ?Jolly Mango, MD ?[facility] ? ? ?

## 2021-11-30 NOTE — Progress Notes (Signed)
Called to room to assist during endoscopic procedure.  Patient ID and intended procedure confirmed with present staff. Received instructions for my participation in the procedure from the performing physician.  

## 2021-11-30 NOTE — Patient Instructions (Addendum)
Increase your omeprazole to twice daily half an hour before eating.  Limit aspirin, aleve and ibuprofen.  Read all of the handouts given to you by your recovery room nurse. ? ?YOU HAD AN ENDOSCOPIC PROCEDURE TODAY AT [facility]:   Refer to the procedure report that was given to you for any specific questions about what was found during the examination.  If the procedure report does not answer your questions, please call your gastroenterologist to clarify.  If you requested that your care partner not be given the details of your procedure findings, then the procedure report has been included in a sealed envelope for you to review at your convenience later. ? ?YOU SHOULD EXPECT: Some feelings of bloating in the abdomen. Passage of more gas than usual.  Walking can help get rid of the air that was put into your GI tract during the procedure and reduce the bloating.  ? ?Please Note:  You might notice some irritation and congestion in your nose or some drainage.  This is from the oxygen used during your procedure.  There is no need for concern and it should clear up in a day or so. ? ?SYMPTOMS TO REPORT IMMEDIATELY: ? ? ?Following upper endoscopy (EGD) ? Vomiting of blood or coffee ground material ? New chest pain or pain under the shoulder blades ? Painful or persistently difficult swallowing ? New shortness of breath ? Fever of 100?F or higher ? Black, tarry-looking stools ? ?For urgent or emergent issues, a gastroenterologist can be reached at any hour by calling [phone]. ?Do not use MyChart messaging for urgent concerns.  ? ? ?DIET:  We do recommend clear liquids until 0900. The a soft diet for the rest of today.  You may proceed to your regular diet.tomorrow.  Drink plenty of fluids but you should avoid alcoholic beverages for 24 hours. ? ?ACTIVITY:  You should plan to take it easy for the rest of today and you should NOT DRIVE or use heavy machinery until tomorrow (because of the sedation  medicines used during the test).   ? ?FOLLOW UP: ?Our staff will call the number listed on your records 48-72 hours following your procedure to check on you and address any questions or concerns that you may have regarding the information given to you following your procedure. If we do not reach you, we will leave a message.  We will attempt to reach you two times.  During this call, we will ask if you have developed any symptoms of COVID 19. If you develop any symptoms (ie: fever, flu-like symptoms, shortness of breath, cough etc.) before then, please call [phone].  If you test positive for Covid 19 in the 2 weeks post procedure, please call and report this information to [place].   ? ?If any biopsies were taken you will be contacted by phone or by letter within the next 1-3 weeks.  Please call us at [phone] if you have not heard about the biopsies in 3 weeks.  ? ? ?SIGNATURES/CONFIDENTIALITY: ?You and/or your care partner have signed paperwork which will be entered into your electronic medical record.  These signatures attest to the fact that that the information above on your After Visit Summary has been reviewed and is understood.  Full responsibility of the confidentiality of this discharge information lies with you and/or your care-partner.  ?

## 2021-12-02 ENCOUNTER — Telehealth: Payer: Self-pay | Admitting: *Deleted

## 2021-12-02 NOTE — Telephone Encounter (Signed)
Attempted to call patient for their post-procedure follow-up call. No answer. Unable to leave voicemail.  

## 2021-12-02 NOTE — Telephone Encounter (Signed)
Pt doesn't speak English and no translator available. ?

## 2021-12-08 ENCOUNTER — Other Ambulatory Visit: Payer: Self-pay

## 2021-12-08 DIAGNOSIS — D509 Iron deficiency anemia, unspecified: Secondary | ICD-10-CM

## 2021-12-14 ENCOUNTER — Other Ambulatory Visit (INDEPENDENT_AMBULATORY_CARE_PROVIDER_SITE_OTHER): Payer: 59

## 2021-12-14 DIAGNOSIS — D509 Iron deficiency anemia, unspecified: Secondary | ICD-10-CM | POA: Diagnosis not present

## 2021-12-14 LAB — CBC
HCT: 39.3 % (ref 39.0–52.0)
Hemoglobin: 12.9 g/dL — ABNORMAL LOW (ref 13.0–17.0)
MCHC: 32.8 g/dL (ref 30.0–36.0)
MCV: 91.7 fl (ref 78.0–100.0)
Platelets: 274 10*3/uL (ref 150.0–400.0)
RBC: 4.28 Mil/uL (ref 4.22–5.81)
RDW: 17.5 % — ABNORMAL HIGH (ref 11.5–15.5)
WBC: 7.2 10*3/uL (ref 4.0–10.5)

## 2021-12-15 LAB — IBC + FERRITIN
Ferritin: 75.7 ng/mL (ref 22.0–322.0)
Iron: 303 ug/dL — ABNORMAL HIGH (ref 42–165)
Saturation Ratios: 83.9 % — ABNORMAL HIGH (ref 20.0–50.0)
TIBC: 361.2 ug/dL (ref 250.0–450.0)
Transferrin: 258 mg/dL (ref 212.0–360.0)

## 2021-12-16 ENCOUNTER — Other Ambulatory Visit: Payer: Self-pay

## 2021-12-16 DIAGNOSIS — D509 Iron deficiency anemia, unspecified: Secondary | ICD-10-CM

## 2022-02-01 ENCOUNTER — Encounter: Payer: 59 | Admitting: Gastroenterology

## 2022-02-16 ENCOUNTER — Telehealth: Payer: Self-pay

## 2022-02-16 NOTE — Telephone Encounter (Signed)
Called patient via interpreter services Shirlean Mylar, Interpreter ID: [id number]). Robin relayed to the patient that he is due for repeat labs at this time. Pt states that he will try to go to the lab on Monday if he is available. Pt had no concerns at the end of the call.

## 2022-02-16 NOTE — Telephone Encounter (Signed)
-----   Message from Yevette Edwards, RN sent at 12/16/2021  4:14 PM EDT ----- Regarding: labs CBC and B12, the orders are in epic

## 2022-04-26 ENCOUNTER — Ambulatory Visit (HOSPITAL_BASED_OUTPATIENT_CLINIC_OR_DEPARTMENT_OTHER)
Admission: RE | Admit: 2022-04-26 | Discharge: 2022-04-26 | Disposition: A | Discharge status: Home / Self Care | Payer: 59 | Source: Ambulatory Visit | Attending: Medical | Admitting: Medical

## 2022-04-26 ENCOUNTER — Ambulatory Visit (INDEPENDENT_AMBULATORY_CARE_PROVIDER_SITE_OTHER): Payer: 59 | Admitting: Medical

## 2022-04-26 ENCOUNTER — Encounter: Payer: Self-pay | Admitting: Medical

## 2022-04-26 VITALS — BP 124/89 | HR 92 | Temp 98.3°F | Resp 16 | Wt 151.0 lb

## 2022-04-26 DIAGNOSIS — R059 Cough, unspecified: Secondary | ICD-10-CM

## 2022-04-26 DIAGNOSIS — R062 Wheezing: Secondary | ICD-10-CM | POA: Diagnosis not present

## 2022-04-26 DIAGNOSIS — J40 Bronchitis, not specified as acute or chronic: Secondary | ICD-10-CM | POA: Diagnosis not present

## 2022-04-26 DIAGNOSIS — K649 Unspecified hemorrhoids: Secondary | ICD-10-CM | POA: Diagnosis not present

## 2022-04-26 MED ORDER — FLUTICASONE PROPIONATE 50 MCG/ACT NA SUSP: 2.0000 | Freq: Every day | NASAL | 1 refills | Status: AC

## 2022-04-26 MED ORDER — DIBUCAINE (PERIANAL) 1 % EX OINT: 1.0000 | TOPICAL_OINTMENT | CUTANEOUS | 0 refills | Status: AC | PRN

## 2022-04-26 MED ORDER — BENZONATATE 100 MG PO CAPS: 100.0000 mg | ORAL_CAPSULE | Freq: Three times a day (TID) | ORAL | 0 refills | Status: AC | PRN

## 2022-04-26 MED ORDER — AZITHROMYCIN 250 MG PO TABS: ORAL_TABLET | ORAL | 0 refills | Status: AC

## 2022-04-26 MED ORDER — FLUTICASONE-SALMETEROL 250-50 MCG/ACT IN AEPB: 1.0000 | INHALATION_SPRAY | Freq: Two times a day (BID) | RESPIRATORY_TRACT | 5 refills | Status: AC

## 2022-04-26 NOTE — Progress Notes (Signed)
Subjective:    Patient ID: Timothy Briggs, male    DOB: 15-Jul-1961, 61 y.o.   MRN: [id number]  HPI  Pt in states sick since Thursday. He states got sick same day he got shingrix vaccines. Pt states only nasal congestion, chest congestion, mild cough and runny nose. Pt states bringing up yellow mucus when he coughs.   Pt states  he feels mild wheeze for 3 days. He states has albuterol and using about 2-3 times a day. He states helps but then will have to use it again.  No chills, no sweats or body aches.  Overall he states he feel a lot better. Mostly just productive.  Pt states he felt low grade fever for 2 days after he got shingrix vaccine.  He states he used old rx of medrol which I rx'd in February . He states has helped.   Pt also states just recently his hemorrhoids flares. Recently flared for 2 weeks.    Review of Systems  Constitutional:  Negative for chills, fatigue and fever.  HENT:  Positive for congestion. Negative for sinus pressure and sinus pain.   Respiratory:  Positive for cough. Negative for shortness of breath.   Cardiovascular:  Negative for chest pain and palpitations.  Gastrointestinal:  Negative for abdominal pain and constipation.       Describes last 2 weeks hemorrhoid flares.  Genitourinary:  Negative for dysuria.  Musculoskeletal:  Negative for back pain.  Neurological:  Negative for dizziness, numbness and headaches.  Hematological:  Negative for adenopathy. Does not bruise/bleed easily.  Psychiatric/Behavioral:  Negative for behavioral problems and confusion.     Past Medical History:  Diagnosis Date   Allergy    Asthma    COPD (chronic obstructive pulmonary disease) (HCC)    beginning of COPD along with Asthma   Hyperlipidemia    not on medicine diet controlled     Social History   Socioeconomic History   Marital status: Divorced    Spouse name: Not on file   Number of children: Not on file   Years of education: Not on file   Highest  education level: Not on file  Occupational History   Occupation: Architectural technologist.  Tobacco Use   Smoking status: Former    Packs/day: 0.50    Years: 32.00    Total pack years: 16.00    Types: Cigarettes    Start date: 07/05/1980    Quit date: 2017    Years since quitting: 6.6   Smokeless tobacco: Never  Vaping Use   Vaping Use: Never used  Substance and Sexual Activity   Alcohol use: No   Drug use: No   Sexual activity: Not on file  Other Topics Concern   Not on file  Social History Narrative   Not on file   Social Determinants of Health   Financial Resource Strain: Not on file  Food Insecurity: Not on file  Transportation Needs: Not on file  Physical Activity: Not on file  Stress: Not on file  Social Connections: Not on file  Intimate Partner Violence: Not on file    Past Surgical History:  Procedure Laterality Date   COLONOSCOPY     in [place] 10+ yrs ago    Family History  Problem Relation Age of Onset   Colon cancer Neg Hx    Colon polyps Neg Hx    Esophageal cancer Neg Hx    Rectal cancer Neg Hx    Stomach cancer Neg Hx  No Known Allergies  Current Outpatient Medications on File Prior to Visit  Medication Sig Dispense Refill   albuterol (VENTOLIN HFA) 108 (90 Base) MCG/ACT inhaler Inhale 2 puffs into the lungs every 6 (six) hours as needed for wheezing or shortness of breath. 1 each 5   atorvastatin (LIPITOR) 40 MG tablet Take 1 tablet by mouth once daily 90 tablet 0   ergocalciferol (VITAMIN D2) 1.25 MG (50000 UT) capsule Take 1 capsule by mouth once a week.     famotidine (PEPCID) 20 MG tablet Take 1 tablet (20 mg total) by mouth daily. 30 tablet 0   ferrous sulfate 325 (65 FE) MG EC tablet Take 1 tablet (325 mg total) by mouth daily with breakfast. 30 tablet 0   fluticasone (FLONASE) 50 MCG/ACT nasal spray Place 2 sprays into both nostrils daily. 16 g 1   levocetirizine (XYZAL) 5 MG tablet Take 1 tablet (5 mg total) by mouth every evening. 30  tablet 3   methylPREDNISolone (MEDROL) 4 MG tablet Standard 6 day taper dose 21 tablet 0   montelukast (SINGULAIR) 10 MG tablet Take 1 tablet (10 mg total) by mouth at bedtime. 30 tablet 11   omeprazole (PRILOSEC) 20 MG capsule Take 1 capsule (20 mg total) by mouth 2 (two) times daily before a meal. 180 capsule 3   [DISCONTINUED] budesonide-formoterol (SYMBICORT) 160-4.5 MCG/ACT inhaler Inhale 2 puffs into the lungs 2 (two) times daily. 1 each 12   No current facility-administered medications on file prior to visit.    BP 124/89 (BP Location: Left Arm, Patient Position: Sitting, Cuff Size: Small)   Pulse 92   Temp 98.3 F (36.8 C) (Oral)   Resp 16   Wt 151 lb (68.5 kg)   SpO2 99%   BMI 28.53 kg/m        Objective:   Physical Exam    General- No acute distress. Pleasant patient. Neck- Full range of motion, no jvd Lungs- Clear, even and unlabored. Heart- regular rate and rhythm. Neurologic- CNII- XII grossly intact.     Assessment & Plan:   Patient Instructions  Bronchitis type symptoms for 5 days.  Overall feeling better compared to the first day.  On review I do not think recent COVID infection patient description.  Considered bronchitis with mild-moderate asthma flare.  Will prescribe azithromycin antibiotic, benzonatate cough med, Flonase for nasal congestion and refilling Advair.  Additionally when ahead and refilled albuterol.  Describes recently using Medrol taper dose better prescribed in February.  Since still wheezing despite using this do think it would be beneficial to get chest x-ray.  Also description of recent hemorrhoid flare.  I did refill his dubicaine rectal meant to use once daily and gave handwritten prescription of Procto-Med HC 2.5% to use twice daily.  28 g dispensed.  He wanted refill of former tube but I could not find it in epic serologies handwritten prescription.  Follow-up in 7 to 10 days or sooner if needed.   Mackie Pai, PA-C

## 2022-04-26 NOTE — Patient Instructions (Addendum)
Bronchitis type symptoms for 5 days.  Overall feeling better compared to the first day.  On review I do not think recent COVID infection patient description.  Considered bronchitis with mild-moderate asthma flare.  Will prescribe azithromycin antibiotic, benzonatate cough med, Flonase for nasal congestion and refilling Advair.  Additionally when ahead and refilled albuterol.  Describes recently using Medrol taper dose better prescribed in February.  Since still wheezing despite using this do think it would be beneficial to get chest x-ray.  Also description of recent hemorrhoid flare.  I did refill his dubicaine rectal meant to use once daily and gave handwritten prescription of Procto-Med HC 2.5% to use twice daily.  28 g dispensed.  He wanted refill of former tube but I could not find it in epic serologies handwritten prescription.  Follow-up in 7 to 10 days or sooner if needed.

## 2024-06-05 ENCOUNTER — Other Ambulatory Visit (HOSPITAL_COMMUNITY): Payer: Self-pay
